# Patient Record
Sex: Female | Born: 1945 | Race: White | Hispanic: No | Marital: Married | Smoking: Former smoker
Health system: Southern US, Community
[De-identification: ages and names within clinical notes are randomized; demographics above are authoritative.]

## PROBLEM LIST (undated history)

## (undated) DIAGNOSIS — Z9889 Other specified postprocedural states: Secondary | ICD-10-CM

## (undated) DIAGNOSIS — M1990 Unspecified osteoarthritis, unspecified site: Secondary | ICD-10-CM

## (undated) DIAGNOSIS — D649 Anemia, unspecified: Secondary | ICD-10-CM

## (undated) DIAGNOSIS — I10 Essential (primary) hypertension: Secondary | ICD-10-CM

## (undated) DIAGNOSIS — R112 Nausea with vomiting, unspecified: Secondary | ICD-10-CM

## (undated) HISTORY — PX: BREAST SURGERY: SHX581

## (undated) HISTORY — PX: TONSILLECTOMY: SUR1361

---

## 1976-01-05 HISTORY — PX: CARPAL TUNNEL RELEASE: SHX101

## 1983-01-05 HISTORY — PX: OTHER SURGICAL HISTORY: SHX169

## 1989-01-04 HISTORY — PX: ABDOMINAL HYSTERECTOMY: SHX81

## 1992-01-05 HISTORY — PX: HEEL SPUR EXCISION: SHX1733

## 1999-01-05 HISTORY — PX: JOINT REPLACEMENT: SHX530

## 2004-01-05 HISTORY — PX: HERNIA REPAIR: SHX51

## 2010-02-04 HISTORY — PX: BIOPSY THYROID: PRO38

## 2010-02-05 ENCOUNTER — Other Ambulatory Visit: Payer: Self-pay | Admitting: Otolaryngology

## 2010-02-05 DIAGNOSIS — E041 Nontoxic single thyroid nodule: Secondary | ICD-10-CM

## 2010-02-10 ENCOUNTER — Telehealth: Payer: Self-pay | Admitting: Radiology

## 2010-02-18 ENCOUNTER — Other Ambulatory Visit (HOSPITAL_COMMUNITY)
Admission: RE | Admit: 2010-02-18 | Discharge: 2010-02-18 | Disposition: A | Discharge status: Home / Self Care | Payer: Medicare Other | Source: Ambulatory Visit | Attending: Interventional Radiology | Admitting: Interventional Radiology

## 2010-02-18 ENCOUNTER — Ambulatory Visit
Admission: RE | Admit: 2010-02-18 | Discharge: 2010-02-18 | Disposition: A | Discharge status: Home / Self Care | Payer: BC Managed Care – PPO | Source: Ambulatory Visit | Attending: Otolaryngology | Admitting: Otolaryngology

## 2010-02-18 ENCOUNTER — Other Ambulatory Visit: Payer: Self-pay | Admitting: Interventional Radiology

## 2010-02-18 DIAGNOSIS — E049 Nontoxic goiter, unspecified: Secondary | ICD-10-CM | POA: Insufficient documentation

## 2010-02-18 DIAGNOSIS — E041 Nontoxic single thyroid nodule: Secondary | ICD-10-CM

## 2010-02-24 ENCOUNTER — Other Ambulatory Visit: Payer: Self-pay

## 2010-06-12 NOTE — Telephone Encounter (Signed)
See above telephone contact note

## 2011-01-14 ENCOUNTER — Encounter (HOSPITAL_COMMUNITY): Payer: Self-pay

## 2011-01-21 ENCOUNTER — Encounter (HOSPITAL_COMMUNITY)
Admission: RE | Admit: 2011-01-21 | Discharge: 2011-01-21 | Disposition: A | Discharge status: Home / Self Care | Payer: Medicare Other | Source: Ambulatory Visit | Attending: Orthopedic Surgery | Admitting: Orthopedic Surgery

## 2011-01-21 ENCOUNTER — Ambulatory Visit (HOSPITAL_COMMUNITY)
Admission: RE | Admit: 2011-01-21 | Discharge: 2011-01-21 | Disposition: A | Discharge status: Home / Self Care | Payer: Medicare Other | Source: Ambulatory Visit | Attending: Orthopedic Surgery | Admitting: Orthopedic Surgery

## 2011-01-21 ENCOUNTER — Encounter (HOSPITAL_COMMUNITY): Payer: Self-pay

## 2011-01-21 DIAGNOSIS — Z01812 Encounter for preprocedural laboratory examination: Secondary | ICD-10-CM | POA: Insufficient documentation

## 2011-01-21 DIAGNOSIS — Z01818 Encounter for other preprocedural examination: Secondary | ICD-10-CM | POA: Insufficient documentation

## 2011-01-21 HISTORY — DX: Unspecified osteoarthritis, unspecified site: M19.90

## 2011-01-21 HISTORY — DX: Anemia, unspecified: D64.9

## 2011-01-21 HISTORY — DX: Essential (primary) hypertension: I10

## 2011-01-21 LAB — DIFFERENTIAL
Lymphocytes Relative: 44 % (ref 12–46)
Lymphs Abs: 2 10*3/uL (ref 0.7–4.0)
Monocytes Relative: 6 % (ref 3–12)
Neutro Abs: 2.3 10*3/uL (ref 1.7–7.7)
Neutrophils Relative %: 49 % (ref 43–77)

## 2011-01-21 LAB — BASIC METABOLIC PANEL
Chloride: 103 mEq/L (ref 96–112)
GFR calc Af Amer: >90 mL/min (ref >90)
GFR calc non Af Amer: >90 mL/min (ref >90)
Potassium: 4 mEq/L (ref 3.5–5.1)
Sodium: 137 mEq/L (ref 135–145)

## 2011-01-21 LAB — TYPE AND SCREEN: ABO/RH(D): O POS

## 2011-01-21 LAB — PROTIME-INR: INR: 0.95 (ref 0.00–1.49)

## 2011-01-21 LAB — SURGICAL PCR SCREEN: Staphylococcus aureus: NEGATIVE

## 2011-01-21 LAB — URINALYSIS, ROUTINE W REFLEX MICROSCOPIC
Ketones, ur: NEGATIVE mg/dL
Leukocytes, UA: NEGATIVE
Nitrite: NEGATIVE
Specific Gravity, Urine: 1.021 (ref 1.005–1.030)
pH: 5.5 (ref 5.0–8.0)

## 2011-01-21 LAB — CBC
Hemoglobin: 12 g/dL (ref 12.0–15.0)
RBC: 4.52 MIL/uL (ref 3.87–5.11)

## 2011-01-21 NOTE — H&P (Signed)
Traci Reed is an 66 y.o. female.    Chief Complaint: right Hip OA and pain   HPI: Pt is a 66 y.o. female complaining of right hip pain since 2011. Pain had continually increased since the beginning, significantly increased and started to really effect her life for the last 6 months. X-rays in the clinic show end-stage arthritic changes of the right hip. Pt has tried various conservative treatments which have failed to alleviate their symptoms, including NSAIDs, PT and steroid injections. Various options are discussed with the patient. Risks, benefits and expectations were discussed with the patient. Patient understand the risks, benefits and expectations and wishes to proceed with surgery.   PCP:  Carmin Richmond, MD, MD  D/C Plans:  Home with HHPT  Post-op Meds:  Rx given ASA, Robaxin, Iron, Colace and MiraLax  Tranexamic Acid:  To be given  PMH: Past Medical History  Diagnosis Date  . Hypertension   . Arthritis   . Diabetes mellitus   . Anemia     PSH: Past Surgical History  Procedure Date  . Biopsy thyroid 02/2010  . Tonsillectomy   . Cervical disk 1985  . Breast surgery     right breast-benign  . Abdominal hysterectomy 1991    complete  . Carpal tunnel release 1978    right  . Heel spur excision 1994    left  . Joint replacement 2001    right  . Hernia repair 2006    abdominal    Social History:  reports that she quit smoking about 16 years ago. Her smoking use included Cigarettes. She has a 30 pack-year smoking history. She does not have any smokeless tobacco history on file. She reports that she does not drink alcohol or use illicit drugs.  Allergies:  No Known Allergies  Medications: No current facility-administered medications on file as of .   Medications Prior to Admission  Medication Sig Dispense Refill  . aspirin EC 81 MG tablet Take 81 mg by mouth every evening.      . Calcium Carbonate-Vit D-Min 1200-1000 MG-UNIT CHEW Chew 1 tablet by mouth 2  (two) times daily.      . cefUROXime (CEFTIN) 500 MG tablet Take 500 mg by mouth 2 (two) times daily.      Marland Kitchen CINNAMON PO Take 1 tablet by mouth 2 (two) times daily.      Marland Kitchen GLUCOSAMINE-CHONDROITIN PO Take 1 tablet by mouth 2 (two) times daily.      . meloxicam (MOBIC) 7.5 MG tablet Take 7.5 mg by mouth 2 (two) times daily.      . metFORMIN (GLUCOPHAGE) 500 MG tablet Take 500-750 mg by mouth 2 (two) times daily. 1.5 tablet in the morning and 1 tablet in the evening      . Multiple Vitamin (MULITIVITAMIN WITH MINERALS) TABS Take 1 tablet by mouth daily before breakfast.      . simvastatin (ZOCOR) 40 MG tablet Take 40 mg by mouth every evening.        Results for orders placed during the hospital encounter of 01/21/11 (from the past 48 hour(s))  URINALYSIS, ROUTINE W REFLEX MICROSCOPIC     Status: Normal   Collection Time   01/21/11 10:56 AM      Component Value Range Comment   Color, Urine YELLOW  YELLOW     APPearance CLEAR  CLEAR     Specific Gravity, Urine 1.021  1.005 - 1.030     pH 5.5  5.0 - 8.0  Glucose, UA NEGATIVE  NEGATIVE (mg/dL)    Hgb urine dipstick NEGATIVE  NEGATIVE     Bilirubin Urine NEGATIVE  NEGATIVE     Ketones, ur NEGATIVE  NEGATIVE (mg/dL)    Protein, ur NEGATIVE  NEGATIVE (mg/dL)    Urobilinogen, UA 0.2  0.0 - 1.0 (mg/dL)    Nitrite NEGATIVE  NEGATIVE     Leukocytes, UA NEGATIVE  NEGATIVE  MICROSCOPIC NOT DONE ON URINES WITH NEGATIVE PROTEIN, BLOOD, LEUKOCYTES, NITRITE, OR GLUCOSE <1000 mg/dL.  SURGICAL PCR SCREEN     Status: Normal   Collection Time   01/21/11 11:19 AM      Component Value Range Comment   MRSA, PCR NEGATIVE  NEGATIVE     Staphylococcus aureus NEGATIVE  NEGATIVE    APTT     Status: Normal   Collection Time   01/21/11 11:40 AM      Component Value Range Comment   aPTT 28  24 - 37 (seconds)   BASIC METABOLIC PANEL     Status: Abnormal   Collection Time   01/21/11 11:40 AM      Component Value Range Comment   Sodium 137  135 - 145 (mEq/L)     Potassium 4.0  3.5 - 5.1 (mEq/L)    Chloride 103  96 - 112 (mEq/L)    CO2 26  19 - 32 (mEq/L)    Glucose, Bld 101 (*) 70 - 99 (mg/dL)    BUN 14  6 - 23 (mg/dL)    Creatinine, Ser 5.78  0.50 - 1.10 (mg/dL)    Calcium 46.9  8.4 - 10.5 (mg/dL)    GFR calc non Af Amer >90  >90 (mL/min)    GFR calc Af Amer >90  >90 (mL/min)   CBC     Status: Normal   Collection Time   01/21/11 11:40 AM      Component Value Range Comment   WBC 4.6  4.0 - 10.5 (K/uL)    RBC 4.52  3.87 - 5.11 (MIL/uL)    Hemoglobin 12.0  12.0 - 15.0 (g/dL)    HCT 62.9  52.8 - 41.3 (%)    MCV 81.6  78.0 - 100.0 (fL)    MCH 26.5  26.0 - 34.0 (pg)    MCHC 32.5  30.0 - 36.0 (g/dL)    RDW 24.4  01.0 - 27.2 (%)    Platelets 217  150 - 400 (K/uL)   DIFFERENTIAL     Status: Normal   Collection Time   01/21/11 11:40 AM      Component Value Range Comment   Neutrophils Relative 49  43 - 77 (%)    Neutro Abs 2.3  1.7 - 7.7 (K/uL)    Lymphocytes Relative 44  12 - 46 (%)    Lymphs Abs 2.0  0.7 - 4.0 (K/uL)    Monocytes Relative 6  3 - 12 (%)    Monocytes Absolute 0.3  0.1 - 1.0 (K/uL)    Eosinophils Relative 1  0 - 5 (%)    Eosinophils Absolute 0.0  0.0 - 0.7 (K/uL)    Basophils Relative 0  0 - 1 (%)    Basophils Absolute 0.0  0.0 - 0.1 (K/uL)   PROTIME-INR     Status: Normal   Collection Time   01/21/11 11:40 AM      Component Value Range Comment   Prothrombin Time 12.9  11.6 - 15.2 (seconds)    INR 0.95  0.00 - 1.49  TYPE AND SCREEN     Status: Normal   Collection Time   01/21/11 11:40 AM      Component Value Range Comment   ABO/RH(D) O POS      Antibody Screen NEG      Sample Expiration 01/24/2011     ABO/RH     Status: Normal   Collection Time   01/21/11  1:00 PM      Component Value Range Comment   ABO/RH(D) O POS      Dg Chest 2 View  01/21/2011  *RADIOLOGY REPORT*  Clinical Data: Preop right total hip arthroplasty  CHEST - 2 VIEW  Comparison: None  Findings: The heart size and mediastinal contours are within  normal limits.  Both lungs are clear.  The visualized skeletal structures are unremarkable.  IMPRESSION: No active cardiopulmonary abnormalities.  Original Report Authenticated By: Rosealee Albee, M.D.    ROS: Review of Systems  Constitutional: Negative.   HENT: Negative.   Eyes: Negative.   Respiratory: Negative.   Cardiovascular: Negative.   Gastrointestinal: Positive for constipation.  Musculoskeletal: Positive for joint pain.  Skin: Negative.   Neurological: Negative.   Endo/Heme/Allergies: Negative.   Psychiatric/Behavioral: Negative.      Physican Exam: BP: 152/82 ; HR: 80 ; Resp: 16 Physical Exam  Constitutional: She is oriented to person, place, and time and well-developed, well-nourished, and in no distress.  HENT:  Head: Normocephalic and atraumatic.  Nose: Nose normal.  Mouth/Throat: Oropharynx is clear and moist.  Eyes: Pupils are equal, round, and reactive to light.  Neck: Neck supple. No JVD present. No tracheal deviation present. No thyromegaly present.  Cardiovascular: Normal rate, regular rhythm, normal heart sounds and intact distal pulses.   Pulmonary/Chest: Effort normal and breath sounds normal. No stridor. No respiratory distress. She has no wheezes. She has no rales. She exhibits no tenderness.  Abdominal: Soft. There is no tenderness. There is no guarding.  Musculoskeletal:       Right hip: She exhibits decreased range of motion (Significantly decrease ROM with pain with flexion, abduction, internal and external rotation), tenderness (Some lateral TTP, but most of the pain is referred to the groin) and bony tenderness. She exhibits no swelling, no deformity and no laceration.  Lymphadenopathy:    She has no cervical adenopathy.  Neurological: She is alert and oriented to person, place, and time.  Skin: Skin is warm and dry. No rash noted. No erythema. No pallor.  Psychiatric: Affect normal.      Assessment/Plan Assessment: right Hip OA and pain    Plan: Patient will undergo a right total hip arthroplasty, anterior approach on 01/28/2011. Risks benefits and expectation were discussed with the patient. Patient understand risks, benefits and expectation and wishes to proceed.   Anastasio Auerbach Anyra Kaufman   PAC  01/21/2011, 3:10 PM

## 2011-01-21 NOTE — Pre-Procedure Instructions (Signed)
EKG from [facility] for 01/19/2011 (4 EKG's) on chart

## 2011-01-21 NOTE — Patient Instructions (Signed)
20 Traci Reed  01/21/2011   Your procedure is scheduled on:  Thursday 01/28/2011 at 0955am  Report to [facility] at [phone] AM.  Call this number if you have problems the morning of surgery: [phone]   Remember:   Do not eat food:After Midnight.  May have clear liquids:until Midnight .  Clear liquids include soda, tea, black coffee, apple or grape juice, broth.  Take these medicines the morning of surgery with A SIP OF WATER: none   Do not wear jewelry, make-up or nail polish.  Do not wear lotions, powders, or perfumes.   Do not shave 48 hours prior to surgery.(women only-shaving legs)  Do not bring valuables to the hospital.  Contacts, dentures or bridgework may not be worn into surgery.  Leave suitcase in the car. After surgery it may be brought to your room.  For patients admitted to the hospital, checkout time is 11:00 AM the day of discharge.   Patients discharged the day of surgery will not be allowed to drive home.  Name and phone number of your driver:   Special Instructions: CHG Shower Use Special Wash: 1/2 bottle night before surgery and 1/2 bottle morning of surgery.   Please read over the following fact sheets that you were given: MRSA Information

## 2011-01-28 ENCOUNTER — Encounter (HOSPITAL_COMMUNITY)
Admission: RE | Disposition: A | Discharge status: Home Health | Payer: Self-pay | Source: Ambulatory Visit | Attending: Orthopedic Surgery

## 2011-01-28 ENCOUNTER — Encounter (HOSPITAL_COMMUNITY): Payer: Self-pay | Admitting: Anesthesiology

## 2011-01-28 ENCOUNTER — Inpatient Hospital Stay (HOSPITAL_COMMUNITY): Payer: Medicare Other

## 2011-01-28 ENCOUNTER — Encounter (HOSPITAL_COMMUNITY): Payer: Self-pay | Admitting: *Deleted

## 2011-01-28 ENCOUNTER — Inpatient Hospital Stay (HOSPITAL_COMMUNITY): Payer: Medicare Other | Admitting: Anesthesiology

## 2011-01-28 ENCOUNTER — Inpatient Hospital Stay (HOSPITAL_COMMUNITY)
Admission: RE | Admit: 2011-01-28 | Discharge: 2011-01-31 | DRG: 470 | Disposition: A | Discharge status: Home Health | Payer: Medicare Other | Source: Ambulatory Visit | Attending: Orthopedic Surgery | Admitting: Orthopedic Surgery

## 2011-01-28 DIAGNOSIS — Z96649 Presence of unspecified artificial hip joint: Secondary | ICD-10-CM

## 2011-01-28 DIAGNOSIS — M169 Osteoarthritis of hip, unspecified: Principal | ICD-10-CM | POA: Diagnosis present

## 2011-01-28 DIAGNOSIS — M1610 Unilateral primary osteoarthritis, unspecified hip: Principal | ICD-10-CM | POA: Diagnosis present

## 2011-01-28 DIAGNOSIS — I10 Essential (primary) hypertension: Secondary | ICD-10-CM | POA: Diagnosis present

## 2011-01-28 DIAGNOSIS — E119 Type 2 diabetes mellitus without complications: Secondary | ICD-10-CM | POA: Diagnosis present

## 2011-01-28 HISTORY — PX: TOTAL HIP ARTHROPLASTY: SHX124

## 2011-01-28 LAB — GLUCOSE, CAPILLARY

## 2011-01-28 SURGERY — ARTHROPLASTY, HIP, TOTAL, ANTERIOR APPROACH
Anesthesia: General | Site: Hip | Laterality: Right | Wound class: Clean

## 2011-01-28 MED ORDER — PROMETHAZINE HCL 25 MG/ML IJ SOLN
6.2500 mg | INTRAMUSCULAR | Status: DC | PRN
  Administered 2011-01-28: 6.25 mg via INTRAVENOUS

## 2011-01-28 MED ORDER — RIVAROXABAN 10 MG PO TABS
10.0000 mg | ORAL_TABLET | ORAL | Status: DC
  Administered 2011-01-30: 10 mg via ORAL
  Filled 2011-01-28 (×5): qty 1

## 2011-01-28 MED ORDER — POTASSIUM CHLORIDE 2 MEQ/ML IV SOLN
100.0000 mL/h | INTRAVENOUS | Status: DC
  Administered 2011-01-28 – 2011-01-29 (×2): 100 mL/h via INTRAVENOUS
  Filled 2011-01-28 (×11): qty 1000

## 2011-01-28 MED ORDER — ROCURONIUM BROMIDE 100 MG/10ML IV SOLN
INTRAVENOUS | Status: DC | PRN
  Administered 2011-01-28: 50 mg via INTRAVENOUS

## 2011-01-28 MED ORDER — MENTHOL 3 MG MT LOZG
1.0000 | LOZENGE | OROMUCOSAL | Status: DC | PRN
  Filled 2011-01-28: qty 9

## 2011-01-28 MED ORDER — METHOCARBAMOL 500 MG PO TABS
500.0000 mg | ORAL_TABLET | Freq: Four times a day (QID) | ORAL | Status: DC | PRN
  Administered 2011-01-28: 500 mg via ORAL
  Filled 2011-01-28: qty 1

## 2011-01-28 MED ORDER — CEFAZOLIN SODIUM-DEXTROSE 2-3 GM-% IV SOLR
2.0000 g | Freq: Four times a day (QID) | INTRAVENOUS | Status: AC
  Administered 2011-01-28 – 2011-01-29 (×3): 2 g via INTRAVENOUS
  Filled 2011-01-28 (×3): qty 50

## 2011-01-28 MED ORDER — METFORMIN HCL 500 MG PO TABS: 500.0000 mg | ORAL_TABLET | Freq: Two times a day (BID) | ORAL | Status: DC

## 2011-01-28 MED ORDER — INSULIN ASPART 100 UNIT/ML ~~LOC~~ SOLN
0.0000 [IU] | Freq: Three times a day (TID) | SUBCUTANEOUS | Status: DC
  Administered 2011-01-28: 2 [IU] via SUBCUTANEOUS
  Administered 2011-01-29 (×2): 3 [IU] via SUBCUTANEOUS
  Administered 2011-01-29 – 2011-01-30 (×3): 2 [IU] via SUBCUTANEOUS
  Administered 2011-01-30: 3 [IU] via SUBCUTANEOUS
  Administered 2011-01-31: 2 [IU] via SUBCUTANEOUS
  Filled 2011-01-28: qty 3

## 2011-01-28 MED ORDER — FENTANYL CITRATE 0.05 MG/ML IJ SOLN
25.0000 ug | INTRAMUSCULAR | Status: DC | PRN
  Administered 2011-01-28 (×2): 50 ug via INTRAVENOUS

## 2011-01-28 MED ORDER — TRANEXAMIC ACID 100 MG/ML IV SOLN
1620.0000 mg | Freq: Once | INTRAVENOUS | Status: AC
  Administered 2011-01-28: 12:00:00 via INTRAVENOUS
  Filled 2011-01-28: qty 16.2

## 2011-01-28 MED ORDER — METFORMIN HCL 500 MG PO TABS
500.0000 mg | ORAL_TABLET | Freq: Every day | ORAL | Status: DC
  Administered 2011-01-28 – 2011-01-30 (×3): 500 mg via ORAL
  Filled 2011-01-28 (×5): qty 1

## 2011-01-28 MED ORDER — DIPHENHYDRAMINE HCL 25 MG PO CAPS: 25.0000 mg | ORAL_CAPSULE | Freq: Four times a day (QID) | ORAL | Status: DC | PRN

## 2011-01-28 MED ORDER — SIMVASTATIN 40 MG PO TABS
40.0000 mg | ORAL_TABLET | Freq: Every evening | ORAL | Status: DC
  Administered 2011-01-28 – 2011-01-30 (×3): 40 mg via ORAL
  Filled 2011-01-28 (×4): qty 1

## 2011-01-28 MED ORDER — METFORMIN HCL 500 MG PO TABS
750.0000 mg | ORAL_TABLET | Freq: Every day | ORAL | Status: DC
  Administered 2011-01-29 – 2011-01-31 (×3): 750 mg via ORAL
  Filled 2011-01-28 (×4): qty 2

## 2011-01-28 MED ORDER — NEOSTIGMINE METHYLSULFATE 1 MG/ML IJ SOLN
INTRAMUSCULAR | Status: DC | PRN
  Administered 2011-01-28: 2 mg via INTRAVENOUS

## 2011-01-28 MED ORDER — ZOLPIDEM TARTRATE 5 MG PO TABS: 5.0000 mg | ORAL_TABLET | Freq: Every evening | ORAL | Status: DC | PRN

## 2011-01-28 MED ORDER — POLYETHYLENE GLYCOL 3350 17 G PO PACK
17.0000 g | PACK | Freq: Two times a day (BID) | ORAL | Status: DC
  Administered 2011-01-28 – 2011-01-31 (×6): 17 g via ORAL
  Filled 2011-01-28 (×7): qty 1

## 2011-01-28 MED ORDER — SUFENTANIL CITRATE 50 MCG/ML IV SOLN
INTRAVENOUS | Status: DC | PRN
  Administered 2011-01-28: 7.5 ug via INTRAVENOUS
  Administered 2011-01-28: 5 ug via INTRAVENOUS
  Administered 2011-01-28: 7.5 ug via INTRAVENOUS
  Administered 2011-01-28 (×3): 5 ug via INTRAVENOUS

## 2011-01-28 MED ORDER — ACETAMINOPHEN 10 MG/ML IV SOLN
INTRAVENOUS | Status: DC | PRN
  Administered 2011-01-28: 1000 mg via INTRAVENOUS

## 2011-01-28 MED ORDER — ACETAMINOPHEN 325 MG PO TABS
650.0000 mg | ORAL_TABLET | Freq: Four times a day (QID) | ORAL | Status: DC | PRN
  Filled 2011-01-28: qty 2

## 2011-01-28 MED ORDER — 0.9 % SODIUM CHLORIDE (POUR BTL) OPTIME
TOPICAL | Status: DC | PRN
  Administered 2011-01-28: 1000 mL

## 2011-01-28 MED ORDER — METOCLOPRAMIDE HCL 10 MG PO TABS: 5.0000 mg | ORAL_TABLET | Freq: Three times a day (TID) | ORAL | Status: DC | PRN

## 2011-01-28 MED ORDER — HYDROMORPHONE HCL PF 1 MG/ML IJ SOLN
0.5000 mg | INTRAMUSCULAR | Status: DC | PRN
  Administered 2011-01-28: 1 mg via INTRAVENOUS
  Filled 2011-01-28: qty 1

## 2011-01-28 MED ORDER — PROPOFOL 10 MG/ML IV BOLUS
INTRAVENOUS | Status: DC | PRN
  Administered 2011-01-28: 200 mg via INTRAVENOUS

## 2011-01-28 MED ORDER — BISACODYL 5 MG PO TBEC: 5.0000 mg | DELAYED_RELEASE_TABLET | Freq: Every day | ORAL | Status: DC | PRN

## 2011-01-28 MED ORDER — CHLORHEXIDINE GLUCONATE 4 % EX LIQD: 60.0000 mL | Freq: Once | CUTANEOUS | Status: DC

## 2011-01-28 MED ORDER — LIDOCAINE HCL (CARDIAC) 20 MG/ML IV SOLN
INTRAVENOUS | Status: DC | PRN
  Administered 2011-01-28: 40 mg via INTRAVENOUS

## 2011-01-28 MED ORDER — PROMETHAZINE HCL 25 MG/ML IJ SOLN
INTRAMUSCULAR | Status: AC
  Filled 2011-01-28: qty 1

## 2011-01-28 MED ORDER — HYDROCODONE-ACETAMINOPHEN 7.5-325 MG PO TABS
1.0000 | ORAL_TABLET | ORAL | Status: DC
  Administered 2011-01-28: 1 via ORAL
  Administered 2011-01-28: 2 via ORAL
  Administered 2011-01-29: 1 via ORAL
  Administered 2011-01-29: 2 via ORAL
  Administered 2011-01-29 (×2): 1 via ORAL
  Administered 2011-01-29 (×2): 2 via ORAL
  Administered 2011-01-30 – 2011-01-31 (×8): 1 via ORAL
  Filled 2011-01-28 (×4): qty 1
  Filled 2011-01-28 (×3): qty 2
  Filled 2011-01-28 (×3): qty 1
  Filled 2011-01-28: qty 2
  Filled 2011-01-28 (×3): qty 1
  Filled 2011-01-28: qty 2
  Filled 2011-01-28: qty 1

## 2011-01-28 MED ORDER — CEFAZOLIN SODIUM-DEXTROSE 2-3 GM-% IV SOLR
2.0000 g | Freq: Once | INTRAVENOUS | Status: AC
  Administered 2011-01-28: 2 g via INTRAVENOUS

## 2011-01-28 MED ORDER — ACETAMINOPHEN 650 MG RE SUPP: 650.0000 mg | Freq: Four times a day (QID) | RECTAL | Status: DC | PRN

## 2011-01-28 MED ORDER — LACTATED RINGERS IV SOLN
INTRAVENOUS | Status: DC
  Administered 2011-01-28: 1000 mL via INTRAVENOUS
  Administered 2011-01-28 (×2): via INTRAVENOUS

## 2011-01-28 MED ORDER — METHOCARBAMOL 100 MG/ML IJ SOLN
500.0000 mg | Freq: Four times a day (QID) | INTRAVENOUS | Status: DC | PRN
  Administered 2011-01-28: 500 mg via INTRAVENOUS
  Filled 2011-01-28 (×2): qty 5

## 2011-01-28 MED ORDER — FLEET ENEMA 7-19 GM/118ML RE ENEM: 1.0000 | ENEMA | Freq: Once | RECTAL | Status: AC | PRN

## 2011-01-28 MED ORDER — METOCLOPRAMIDE HCL 5 MG/ML IJ SOLN
5.0000 mg | Freq: Three times a day (TID) | INTRAMUSCULAR | Status: DC | PRN
  Administered 2011-01-29: 10 mg via INTRAVENOUS
  Filled 2011-01-28: qty 2

## 2011-01-28 MED ORDER — ONDANSETRON HCL 4 MG/2ML IJ SOLN
INTRAMUSCULAR | Status: DC | PRN
  Administered 2011-01-28: 4 mg via INTRAVENOUS

## 2011-01-28 MED ORDER — DOCUSATE SODIUM 100 MG PO CAPS
100.0000 mg | ORAL_CAPSULE | Freq: Two times a day (BID) | ORAL | Status: DC
  Administered 2011-01-28 – 2011-01-31 (×6): 100 mg via ORAL
  Filled 2011-01-28 (×7): qty 1

## 2011-01-28 MED ORDER — MIDAZOLAM HCL 5 MG/5ML IJ SOLN
INTRAMUSCULAR | Status: DC | PRN
  Administered 2011-01-28: 2 mg via INTRAVENOUS

## 2011-01-28 MED ORDER — ONDANSETRON HCL 4 MG PO TABS: 4.0000 mg | ORAL_TABLET | Freq: Four times a day (QID) | ORAL | Status: DC | PRN

## 2011-01-28 MED ORDER — TRANEXAMIC ACID 100 MG/ML IV SOLN
1620.0000 mg | INTRAVENOUS | Status: DC | PRN
  Administered 2011-01-28: 1620 mg via INTRAVENOUS

## 2011-01-28 MED ORDER — GLYCOPYRROLATE 0.2 MG/ML IJ SOLN
INTRAMUSCULAR | Status: DC | PRN
  Administered 2011-01-28: .3 mg via INTRAVENOUS

## 2011-01-28 MED ORDER — ONDANSETRON HCL 4 MG/2ML IJ SOLN
4.0000 mg | Freq: Four times a day (QID) | INTRAMUSCULAR | Status: DC | PRN
  Administered 2011-01-29: 4 mg via INTRAVENOUS
  Filled 2011-01-28: qty 2

## 2011-01-28 MED ORDER — ALUM & MAG HYDROXIDE-SIMETH 200-200-20 MG/5ML PO SUSP: 30.0000 mL | ORAL | Status: DC | PRN

## 2011-01-28 MED ORDER — FERROUS SULFATE 325 (65 FE) MG PO TABS
325.0000 mg | ORAL_TABLET | Freq: Three times a day (TID) | ORAL | Status: DC
  Administered 2011-01-28 – 2011-01-31 (×8): 325 mg via ORAL
  Filled 2011-01-28 (×10): qty 1

## 2011-01-28 MED ORDER — PHENOL 1.4 % MT LIQD
1.0000 | OROMUCOSAL | Status: DC | PRN
  Filled 2011-01-28: qty 177

## 2011-01-28 MED ORDER — EPHEDRINE SULFATE 50 MG/ML IJ SOLN
INTRAMUSCULAR | Status: DC | PRN
  Administered 2011-01-28 (×6): 5 mg via INTRAVENOUS

## 2011-01-28 MED ORDER — LACTATED RINGERS IV SOLN: INTRAVENOUS | Status: DC

## 2011-01-28 SURGICAL SUPPLY — 43 items
BAG ZIPLOCK 12X15 (MISCELLANEOUS) ×4 IMPLANT
BLADE SAW SGTL 18X1.27X75 (BLADE) ×2 IMPLANT
CELLS DAT CNTRL 66122 CELL SVR (MISCELLANEOUS) ×1 IMPLANT
CLOTH BEACON ORANGE TIMEOUT ST (SAFETY) ×2 IMPLANT
DERMABOND ADVANCED (GAUZE/BANDAGES/DRESSINGS) ×1
DERMABOND ADVANCED .7 DNX12 (GAUZE/BANDAGES/DRESSINGS) ×1 IMPLANT
DRAPE C-ARM 42X72 X-RAY (DRAPES) ×2 IMPLANT
DRAPE STERI IOBAN 125X83 (DRAPES) ×2 IMPLANT
DRAPE U-SHAPE 47X51 STRL (DRAPES) ×6 IMPLANT
DRSG AQUACEL AG ADV 3.5X10 (GAUZE/BANDAGES/DRESSINGS) ×2 IMPLANT
DRSG TEGADERM 4X4.75 (GAUZE/BANDAGES/DRESSINGS) ×2 IMPLANT
DURAPREP 26ML APPLICATOR (WOUND CARE) ×2 IMPLANT
ELECT BLADE TIP CTD 4 INCH (ELECTRODE) ×2 IMPLANT
ELECT REM PT RETURN 9FT ADLT (ELECTROSURGICAL) ×2
ELECTRODE REM PT RTRN 9FT ADLT (ELECTROSURGICAL) ×1 IMPLANT
EVACUATOR 1/8 PVC DRAIN (DRAIN) IMPLANT
FACESHIELD LNG OPTICON STERILE (SAFETY) ×8 IMPLANT
GAUZE SPONGE 2X2 8PLY STRL LF (GAUZE/BANDAGES/DRESSINGS) ×1 IMPLANT
GLOVE BIOGEL PI IND STRL 7.0 (GLOVE) ×2 IMPLANT
GLOVE BIOGEL PI IND STRL 7.5 (GLOVE) ×2 IMPLANT
GLOVE BIOGEL PI IND STRL 8 (GLOVE) ×1 IMPLANT
GLOVE BIOGEL PI INDICATOR 7.0 (GLOVE) ×2
GLOVE BIOGEL PI INDICATOR 7.5 (GLOVE) ×2
GLOVE BIOGEL PI INDICATOR 8 (GLOVE) ×1
GLOVE ECLIPSE 8.0 STRL XLNG CF (GLOVE) ×2 IMPLANT
GLOVE ORTHO TXT STRL SZ7.5 (GLOVE) ×4 IMPLANT
GLOVE SURG SS PI 6.5 STRL IVOR (GLOVE) ×2 IMPLANT
GLOVE SURG SS PI 7.0 STRL IVOR (GLOVE) ×4 IMPLANT
GLOVE SURG SS PI 7.5 STRL IVOR (GLOVE) ×2 IMPLANT
GOWN BRE IMP PREV XXLGXLNG (GOWN DISPOSABLE) ×2 IMPLANT
GOWN STRL NON-REIN LRG LVL3 (GOWN DISPOSABLE) ×2 IMPLANT
KIT BASIN OR (CUSTOM PROCEDURE TRAY) ×2 IMPLANT
PACK TOTAL JOINT (CUSTOM PROCEDURE TRAY) ×2 IMPLANT
PADDING CAST COTTON 6X4 STRL (CAST SUPPLIES) ×2 IMPLANT
RTRCTR WOUND ALEXIS 18CM MED (MISCELLANEOUS) ×2
SPONGE GAUZE 2X2 STER 10/PKG (GAUZE/BANDAGES/DRESSINGS) ×1
SUCTION FRAZIER 12FR DISP (SUCTIONS) ×2 IMPLANT
SUT MNCRL AB 4-0 PS2 18 (SUTURE) ×2 IMPLANT
SUT VIC AB 1 CT1 36 (SUTURE) ×8 IMPLANT
SUT VIC AB 2-0 CT1 27 (SUTURE) ×2
SUT VIC AB 2-0 CT1 TAPERPNT 27 (SUTURE) ×2 IMPLANT
TOWEL OR 17X26 10 PK STRL BLUE (TOWEL DISPOSABLE) ×4 IMPLANT
TRAY FOLEY CATH 14FRSI W/METER (CATHETERS) ×2 IMPLANT

## 2011-01-28 NOTE — Transfer of Care (Signed)
Immediate Anesthesia Transfer of Care Note  Patient: Traci Reed  Procedure(s) Performed:  TOTAL HIP ARTHROPLASTY ANTERIOR APPROACH  Patient Location: PACU  Anesthesia Type: General  Level of Consciousness: awake, alert  and oriented  Airway & Oxygen Therapy: Patient Spontanous Breathing and Patient connected to face mask oxygen  Post-op Assessment: Report given to PACU RN  Post vital signs: Reviewed and stable  Complications: No apparent anesthesia complications

## 2011-01-28 NOTE — Interval H&P Note (Signed)
History and Physical Interval Note:  01/28/2011 7:20 AM  [name]  has presented today for surgery, with the diagnosis of Ostearthritis of Right Hip  The various methods of treatment have been discussed with the patient and family. After consideration of risks, benefits and other options for treatment, the patient has consented to  Procedure(s): RIGHT TOTAL HIP ARTHROPLASTY ANTERIOR APPROACH as a surgical intervention .  The patients' history has been reviewed, patient examined, no change in status, stable for surgery.  I have reviewed the patients' chart and labs.  Questions were answered to the patient's satisfaction.     Traci Reed

## 2011-01-28 NOTE — Op Note (Signed)
NAME:  Traci Reed                ACCOUNT NO.: [id number]      MEDICAL RECORD NO.: [id number]      FACILITY:  [facility]      PHYSICIAN:  Durene Romans D  DATE OF BIRTH:  [birth year]     DATE OF PROCEDURE:  01/28/2011                                 OPERATIVE REPORT         PREOPERATIVE DIAGNOSIS: Right  hip osteoarthritis.      POSTOPERATIVE DIAGNOSIS:  Right osteoarthritis.      PROCEDURE:  Right total hip replacement through an anterior approach   utilizing DePuy THR system, component size 52 pinnacle cup, a size 36+4 neutral   Altrex liner, a size 6Hi Tri Lock stem with a 36-2 aSphere metal head.   ball.      SURGEON:  Madlyn Frankel. Charlann Boxer, M.D.      ASSISTANT:  Lanney Gins, PA      ANESTHESIA:  General.      SPECIMENS:  None.      COMPLICATIONS:  None.      BLOOD LOSS:  600 cc     DRAINS:  One Hemovac.      INDICATION OF THE PROCEDURE:  Traci Reed is a 66 y.o. female who had   presented to office for evaluation of right hip pain.  Radiographs revealed   progressive degenerative changes with bone-on-bone   articulation to the  hip joint.  The patient had painful limited range of   motion significantly affecting their overall quality of life.  The patient was failing to    respond to conservative measures, and at this point was ready   to proceed with more definitive measures.  The patient has noted progressive   degenerative changes in his hip, progressive problems and dysfunction   with regarding the hip prior to surgery.  Consent was obtained for   benefit of pain relief.  Specific risk of infection, DVT, component   failure, dislocation, need for revision surgery, as well discussion of   the anterior versus posterior approach were reviewed.  Consent was   obtained for benefit of anterior pain relief through an anterior   approach.      PROCEDURE IN DETAIL:  The patient was brought to operative theater.   Once adequate anesthesia, preoperative  antibiotics, 2gm Ancef administered.   The patient was positioned supine on the OSI Hanna table.  Once adequate   padding of boney process was carried out, we had predraped out the hip, and  used fluoroscopy to confirm orientation of the pelvis and position.      The right hip was then prepped and draped from proximal iliac crest to   mid thigh with shower curtain technique.      Time-out was performed identifying the patient, planned procedure, and   extremity.     An incision was then made 2 cm distal and lateral to the   anterior superior iliac spine extending over the orientation of the   tensor fascia lata muscle and sharp dissection was carried down to the   fascia of the muscle and protractor placed in the soft tissues.      The fascia was then incised.  The muscle belly was identified and swept  laterally and retractor placed along the superior neck.  Following   cauterization of the circumflex vessels and removing some pericapsular   fat, a second cobra retractor was placed on the inferior neck.  A third   retractor was placed on the anterior acetabulum after elevating the   anterior rectus.  A L-capsulotomy was along the line of the   superior neck to the trochanteric fossa, then extended proximally and   distally.  Tag sutures were placed and the retractors were then placed   intracapsular.  We then identified the trochanteric fossa and   orientation of my neck cut, confirmed this radiographically   and then made a neck osteotomy with the femur on traction.  The femoral   head was removed without difficulty or complication.  Traction was let   off and retractors were placed posterior and anterior around the   acetabulum.      The labrum and foveal tissue were debrided.  I began reaming with a 46mm   reamer and reamed up to 51 reamer with good bony bed preparation and a 52mm   cup was chosen.  The final 52 Pinnacle cup was then impacted under fluoroscopy  to confirm the  depth of penetration and orientation with respect to   abduction.  A screw was placed followed by the hole eliminator.  The final   36+4 Altrex liner was impacted with good visualized rim fit.  The cup was positioned anatomically within the acetabular portion of the pelvis.      At this point, the femur was rolled at 80 degrees.  Further capsule was   released off the inferior aspect of the femoral neck.  I then   released the superior capsule proximally.  The hook was placed laterally   along the femur and elevated manually and held in position with the bed   hook.  The leg was then extended and adducted with the leg rolled to 100   degrees of external rotation.  Once the proximal femur was fully   exposed, I used a box osteotome to set orientation.  I then began   broaching with the starting chili pepper broach and passed this by hand and then broached up to 6.  With the 6 broach in place I chose a high offset neck and did a trial reduction.   The offset was appropriate, leg lengths   appeared to be equal using a 36-2 head, confirmed radiographically.   Given these findings, I went ahead and dislocated the hip, repositioned all   retractors and positioned the right hip in the extended and abducted position.  The final 6Hi  Tri Lock stem was   chosen and it was impacted down to the level of neck cut.  Based on this   and the trial reduction, a 36-2 aSphere metal head was chosen and   impacted onto a clean and dry trunnion, and the hip was reduced.  The   hip had been irrigated throughout the case again at this point.  I did   reapproximate the superior capsular leaflet to the anterior leaflet   using #1 Vicryl, placed a medium Hemovac drain deep.  The fascia of the   tensor fascia lata muscle was then reapproximated using #1 Vicryl.  The   remaining wound was closed with 2-0 Vicryl and running 4-0 Monocryl.   The hip was cleaned, dried, and dressed sterilely using Dermabond and   Aquacel  dressing.  Drain site dressed  separately.  She was then brought   to recovery room in stable condition tolerating the procedure well.    Lanney Gins, PA-C was present for the entirety of the case involved from   preoperative positioning, perioperative retractor management, general   facilitation of the case, as well as primary wound closure as assistant.            Madlyn Frankel Charlann Boxer, M.D.            MDO/MEDQ  D:  10/27/2010  T:  10/27/2010  Job:  [id number]      Electronically Signed by Durene Romans M.D. on 11/02/2010 09:15:38 AM

## 2011-01-28 NOTE — Anesthesia Postprocedure Evaluation (Signed)
Anesthesia Post Note  Patient: Traci Reed  Procedure(s) Performed:  TOTAL HIP ARTHROPLASTY ANTERIOR APPROACH  Anesthesia type: General  Patient location: PACU  Post pain: Pain level controlled  Post assessment: Post-op Vital signs reviewed  Last Vitals:  Filed Vitals:   01/28/11 1430  BP: 114/49  Pulse: 81  Temp: 36.8 C  Resp: 18    Post vital signs: Reviewed  Level of consciousness: sedated  Complications: No apparent anesthesia complications

## 2011-01-28 NOTE — Addendum Note (Signed)
Addendum  created 01/28/11 1507 by Garth Bigness, CRNA   Modules edited:Anesthesia Events, Notes Section

## 2011-01-28 NOTE — Transfer of Care (Signed)
Immediate Anesthesia Transfer of Care Note  Patient: Traci Reed  Procedure(s) Performed:  TOTAL HIP ARTHROPLASTY ANTERIOR APPROACH  Patient Location: PACU  Anesthesia Type: General  Level of Consciousness: awake, alert  and oriented  Airway & Oxygen Therapy: Patient Spontanous Breathing and Patient connected to face mask oxygen  Post-op Assessment: Report given to PACU RN, Post -op Vital signs reviewed and stable and Patient moving all extremities  Post vital signs: Reviewed and stable  Complications: No apparent anesthesia complications

## 2011-01-28 NOTE — Anesthesia Preprocedure Evaluation (Signed)
Anesthesia Evaluation  Patient identified by MRN, date of birth, ID band Patient awake    Reviewed: Allergy & Precautions, H&P , NPO status , Patient's Chart, lab work & pertinent test results  History of Anesthesia Complications Negative for: history of anesthetic complications  Airway Mallampati: II TM Distance: >3 FB Neck ROM: Full    Dental No notable dental hx. (+) Teeth Intact and Dental Advisory Given   Pulmonary neg pulmonary ROS,  clear to auscultation  Pulmonary exam normal       Cardiovascular hypertension, Regular Normal    Neuro/Psych Negative Neurological ROS  Negative Psych ROS   GI/Hepatic negative GI ROS, Neg liver ROS,   Endo/Other  Negative Endocrine ROSDiabetes mellitus-, Type 2, Oral Hypoglycemic AgentsMorbid obesity  Renal/GU negative Renal ROS  Genitourinary negative   Musculoskeletal negative musculoskeletal ROS (+)   Abdominal   Peds negative pediatric ROS (+)  Hematology negative hematology ROS (+)   Anesthesia Other Findings   Reproductive/Obstetrics negative OB ROS                           Anesthesia Physical Anesthesia Plan  ASA: II  Anesthesia Plan: General   Post-op Pain Management:    Induction: Intravenous  Airway Management Planned: Oral ETT  Additional Equipment:   Intra-op Plan:   Post-operative Plan: Extubation in OR  Informed Consent: I have reviewed the patients History and Physical, chart, labs and discussed the procedure including the risks, benefits and alternatives for the proposed anesthesia with the patient or authorized representative who has indicated his/her understanding and acceptance.   Dental advisory given  Plan Discussed with: CRNA  Anesthesia Plan Comments:         Anesthesia Quick Evaluation

## 2011-01-29 LAB — BASIC METABOLIC PANEL
BUN: 8 mg/dL (ref 6–23)
Chloride: 102 mEq/L (ref 96–112)
GFR calc Af Amer: >90 mL/min (ref >90)
GFR calc non Af Amer: >90 mL/min (ref >90)
Glucose, Bld: 166 mg/dL — ABNORMAL HIGH (ref 70–99)
Potassium: 4 mEq/L (ref 3.5–5.1)
Sodium: 135 mEq/L (ref 135–145)

## 2011-01-29 LAB — GLUCOSE, CAPILLARY
Glucose-Capillary: 126 mg/dL — ABNORMAL HIGH (ref 70–99)
Glucose-Capillary: 174 mg/dL — ABNORMAL HIGH (ref 70–99)
Glucose-Capillary: 173 mg/dL — ABNORMAL HIGH (ref 70–99)

## 2011-01-29 LAB — CBC
HCT: 29.4 % — ABNORMAL LOW (ref 36.0–46.0)
Hemoglobin: 9.7 g/dL — ABNORMAL LOW (ref 12.0–15.0)
MCHC: 33 g/dL (ref 30.0–36.0)
RBC: 3.54 MIL/uL — ABNORMAL LOW (ref 3.87–5.11)
WBC: 8.8 10*3/uL (ref 4.0–10.5)

## 2011-01-29 MED ORDER — DIPHENHYDRAMINE HCL 25 MG PO CAPS: 25.0000 mg | ORAL_CAPSULE | Freq: Four times a day (QID) | ORAL | Status: AC | PRN

## 2011-01-29 MED ORDER — FERROUS SULFATE 325 (65 FE) MG PO TABS: 325.0000 mg | ORAL_TABLET | Freq: Three times a day (TID) | ORAL | Status: DC

## 2011-01-29 MED ORDER — HYDROCODONE-ACETAMINOPHEN 7.5-325 MG PO TABS: 1.0000 | ORAL_TABLET | ORAL | Status: AC

## 2011-01-29 MED ORDER — METHOCARBAMOL 500 MG PO TABS: 500.0000 mg | ORAL_TABLET | Freq: Four times a day (QID) | ORAL | Status: AC | PRN

## 2011-01-29 MED ORDER — ASPIRIN EC 325 MG PO TBEC: 325.0000 mg | DELAYED_RELEASE_TABLET | Freq: Two times a day (BID) | ORAL | Status: AC

## 2011-01-29 MED ORDER — DSS 100 MG PO CAPS: 100.0000 mg | ORAL_CAPSULE | Freq: Two times a day (BID) | ORAL | Status: AC

## 2011-01-29 MED ORDER — POLYETHYLENE GLYCOL 3350 17 G PO PACK: 17.0000 g | PACK | Freq: Two times a day (BID) | ORAL | Status: AC

## 2011-01-29 NOTE — Progress Notes (Signed)
CARE MANAGEMENT NOTE 01/29/2011  Patient:  Traci Reed, Traci Reed   Account Number:  [id number]  Date Initiated:  01/29/2011  Documentation initiated by:  Lashanta Elbe  Subjective/Objective Assessment:   66 yo female admitted 01/28/11 with osteoarthritis of right hip S/P right hip replacement     Action/Plan:   D/C when medicaly stable   Anticipated DC Date:  02/01/2011   Anticipated DC Plan:  HOME W HOME HEALTH SERVICES      DC Planning Services  CM consult      [facility] Choice  HOME HEALTH  DURABLE MEDICAL EQUIPMENT   Choice offered to / List presented to:  C-1 Patient   DME arranged  3-N-1      DME agency  [facility]     [facility] arranged  HH-2 PT      [facility] agency  [facility]   Status of service:  In process, will continue to follow  Comments:  01/29/11, Kathi Der RNC-MNN, BSN, [phone], CM received referral.  CM met with pt. who states husband will be at home to assist her upon d/c.  CC presented choice for [facility] services and pt stated that she will be using [place] for [facility] services.  Debbie at [place] contacted with [facility] orders and confirmation of services received.  Pt. states that she already has a rolling walker at home.

## 2011-01-29 NOTE — Progress Notes (Signed)
Physical Therapy Treatment Patient Details Name: Traci Reed MRN: [id number] DOB: [birth year] Today's Date: 01/29/2011  R THR Dir Anterior WBAT POD #1 pm session 15;20 - 15:45 1gt  1 ta  PT Assessment/Plan  PT - Assessment/Plan Comments on Treatment Session: Pt plans to D/C to home with spouse PT Plan: Discharge plan remains appropriate Follow Up Recommendations: Home health PT PT Goals  Acute Rehab PT Goals PT Goal Formulation: With patient Pt will go Supine/Side to Sit: with min assist PT Goal: Supine/Side to Sit - Progress: Progressing toward goal Pt will go Sit to Supine/Side: with min assist PT Goal: Sit to Supine/Side - Progress: Met Pt will go Sit to Stand: with supervision PT Goal: Sit to Stand - Progress: Progressing toward goal Pt will go Stand to Sit: with modified independence PT Goal: Stand to Sit - Progress: Progressing toward goal Pt will Ambulate: 51 - 150 feet;with supervision;with rolling walker PT Goal: Ambulate - Progress: Partly met Pt will Go Up / Down Stairs: 3-5 stairs;with min assist;with least restrictive assistive device PT Goal: Up/Down Stairs - Progress: Progressing toward goal Pt will Perform Home Exercise Program: with supervision, verbal cues required/provided PT Goal: Perform Home Exercise Program - Progress: Not met  PT Treatment Precautions/Restrictions  Restrictions Weight Bearing Restrictions: Yes RLE Weight Bearing: Weight bearing as tolerated Mobility (including Balance) Bed Mobility Bed Mobility: Yes Sit to Supine: 4: Min assist Sit to Supine - Details (indicate cue type and reason): increased time and min assist to support R LE up bed Transfers Transfers: Yes Sit to Stand: 4: Min assist;From chair/3-in-1 Sit to Stand Details (indicate cue type and reason): <25% VC's on hand placement and increased time Stand to Sit: 5: Supervision;To bed Stand to Sit Details: one VC on turn completion and hand placement prior to  sit Ambulation/Gait Ambulation/Gait: Yes Ambulation/Gait Assistance: Other (comment) (MinGuard assist) Ambulation/Gait Assistance Details (indicate cue type and reason): increased time and one VC on proper posture and safety with turns Ambulation Distance (Feet): 250 Feet Assistive device: Rolling walker Gait Pattern: Step-through pattern;Decreased stance time - right;Trunk flexed Stairs: No Wheelchair Mobility Wheelchair Mobility: No    Exercise    End of Session PT - End of Session Equipment Utilized During Treatment: Gait belt Activity Tolerance: Patient tolerated treatment well Patient left: in bed;with call bell in reach General Behavior During Session: [facility] for tasks performed Cognition: [facility] for tasks performed  Felecia Shelling  PTA [facility]  Acute  Rehab Pager     [phone]

## 2011-01-29 NOTE — Progress Notes (Addendum)
Subjective: 1 Day Post-Op Procedure(s) (LRB): TOTAL HIP ARTHROPLASTY ANTERIOR APPROACH (Right)   Patient reports pain as mild. No events.  Objective:   VITALS:   Filed Vitals:   01/29/11 0548  BP: 113/74  Pulse: 73  Temp: 98.1 F (36.7 C)  Resp: 18    Neurovascular intact Dorsiflexion/Plantar flexion intact Incision: dressing C/D/I No cellulitis present Compartment soft  LABS  Basename 01/29/11 0355  HGB 9.7*  HCT 29.4*  WBC 8.8  PLT 168     Basename 01/29/11 0355  NA 135  K 4.0  BUN 8  CREATININE 0.61  GLUCOSE 166*     Assessment/Plan: 1 Day Post-Op Procedure(s) (LRB): TOTAL HIP ARTHROPLASTY ANTERIOR APPROACH (Right)  Foley cath d/c'ed HV drain d/c'ed Advance diet Up with therapy D/C IV fluids Discharge home with home health Saturday if continues to do well     Anastasio Auerbach. Anairis Knick   PAC  01/29/2011, 8:06 AM

## 2011-01-29 NOTE — Progress Notes (Signed)
Utilization review completed.  

## 2011-01-29 NOTE — Evaluation (Signed)
Occupational Therapy Evaluation Patient Details Name: Traci Reed MRN: [id number] DOB: Dec 25, 1945 Today's Date: 01/29/2011  Problem List:  Patient Active Problem List  Diagnoses  . S/P right hip replacement    Past Medical History:  Past Medical History  Diagnosis Date  . Hypertension   . Arthritis   . Diabetes mellitus   . Anemia    Past Surgical History:  Past Surgical History  Procedure Date  . Biopsy thyroid 02/2010  . Tonsillectomy   . Cervical disk 1985  . Breast surgery     right breast-benign  . Abdominal hysterectomy 1991    complete  . Carpal tunnel release 1978    right  . Heel spur excision 1994    left  . Joint replacement 2001    right  . Hernia repair 2006    abdominal    OT Assessment/Plan/Recommendation OT Assessment Clinical Impression Statement: Pt presents w/ decreased independence in areas of lower body ADL's and selfcare tasks as well as functional mobility. She has A/E @ home & plans to have husband assist PRN @ D/C. She will need 3:1 OT Recommendation/Assessment: Patient will need skilled OT in the acute care venue OT Problem List: Decreased knowledge of use of DME or AE;Pain OT Therapy Diagnosis : Generalized weakness OT Plan OT Frequency: Min 2X/week OT Treatment/Interventions: Self-care/ADL training;Therapeutic activities;Energy conservation OT Recommendation Follow Up Recommendations: No OT follow up Equipment Recommended: 3 in 1 bedside comode Individuals Consulted Consulted and Agree with Results and Recommendations: Patient OT Goals Acute Rehab OT Goals OT Goal Formulation: With patient Time For Goal Achievement: 5 days ADL Goals Pt Will Perform Lower Body Bathing: with supervision;with min assist;with adaptive equipment;Sit to stand from chair;Sit to stand from bed ADL Goal: Lower Body Bathing - Progress: Goal set today Pt Will Perform Lower Body Dressing: with supervision;with min assist;with adaptive equipment;Sit to  stand from chair;Sit to stand from bed ADL Goal: Lower Body Dressing - Progress: Goal set today Pt Will Perform Tub/Shower Transfer: Shower transfer;with supervision;Shower seat with back;with DME;Ambulation ADL Goal: Tub/Shower Transfer - Progress: Goal set today  OT Evaluation Precautions/Restrictions  Restrictions Weight Bearing Restrictions: Yes RLE Weight Bearing: Weight bearing as tolerated Prior Functioning Home Living Lives With: Spouse Receives Help From: Family Type of Home: House Home Layout: One level Home Access: Stairs to enter Entrance Stairs-Rails: Doctor, general practice of Steps: 4 Bathroom Shower/Tub: Health visitor: Standard Bathroom Accessibility: Yes How Accessible: Accessible via walker Home Adaptive Equipment: Straight cane;Walker - rolling;Walker - standard;Built-in shower seat;Grab bars in shower Prior Function Level of Independence: Independent with homemaking with ambulation;Independent with gait;Independent with transfers;Independent with basic ADLs Driving: Yes Vocation: Retired Comments: Runner, broadcasting/film/video ADL ADL Eating/Feeding: Performed;Modified independent Where Assessed - Eating/Feeding: Bed level;Chair Grooming: Performed;Modified independent Where Assessed - Grooming: Sitting, chair Upper Body Bathing: Simulated;Modified independent;Set up Upper Body Bathing Details (indicate cue type and reason): set up to gather items for pt Where Assessed - Upper Body Bathing: Sitting, chair Lower Body Bathing: Simulated;Minimal assistance Where Assessed - Lower Body Bathing: Sitting, chair Upper Body Dressing: Simulated;Modified independent Where Assessed - Upper Body Dressing: Sitting, bed Lower Body Dressing: Simulated;Minimal assistance Where Assessed - Lower Body Dressing: Sit to stand from bed;Sit to stand from chair;Unsupported Toilet Transfer: Performed;Supervision/safety Toilet Transfer Method: Network engineer: Bedside commode Toileting - Clothing Manipulation: Performed;Supervision/safety Where Assessed - Glass blower/designer Manipulation: Standing Toileting - Hygiene: Performed;Modified independent Where Assessed - Toileting Hygiene: Sit on 3-in-1 or toilet;Sit to stand from  3-in-1 or toilet Tub/Shower Transfer: Not assessed Equipment Used: Rolling walker;Other (comment) (3:1, pt ed A/E & hand out issued, has A/E) Ambulation Related to ADLs: Supervision for safety and cues ADL Comments: Pt presents w/ decreased independence in areas of lower body ADL's and selfcare tasks as well as functional mobility. She has A/E @ home & plans to have husband assist PRN @ D/C. She will need 3:1 Vision/Perception  Vision - History Baseline Vision: Wears glasses all the time Patient Visual Report: No change from baseline Cognition Cognition Arousal/Alertness: Awake/alert Overall Cognitive Status: Appears within functional limits for tasks assessed Orientation Level: Oriented X4 Sensation/Coordination Sensation Light Touch: Appears Intact Coordination Gross Motor Movements are Fluid and Coordinated: Yes Fine Motor Movements are Fluid and Coordinated: Yes Extremity Assessment RUE Assessment RUE Assessment: Within Functional Limits LUE Assessment LUE Assessment: Within Functional Limits Mobility  Bed Mobility Bed Mobility: Yes Supine to Sit: 4: Min assist Supine to Sit Details (indicate cue type and reason): R LE assist  Sitting - Scoot to Edge of Bed: 5: Supervision Transfers Transfers: Yes Sit to Stand: 4: Min assist;From bed;From chair/3-in-1 Sit to Stand Details (indicate cue type and reason): VC's for hand placement and safety/sequencing w/ RW Stand to Sit: 5: Supervision;4: Min assist;To chair/3-in-1 Stand to Sit Details: cues for hands End of Session OT - End of Session Equipment Utilized During Treatment: Gait belt;Other (comment) (RW; 3:1) Activity Tolerance: Patient tolerated  treatment well Patient left: in chair;with call bell in reach General Behavior During Session: [facility] for tasks performed Cognition: [facility] for tasks performed   Alm Bustard 01/29/2011, 11:01 AM

## 2011-01-29 NOTE — Evaluation (Signed)
Physical Therapy Evaluation Patient Details Name: Traci Reed MRN: [id number] DOB: 08-03-1945 Today's Date: 01/29/2011 Ev 2 Problem List:  Patient Active Problem List  Diagnoses  . S/P right hip replacement    Past Medical History:  Past Medical History  Diagnosis Date  . Hypertension   . Arthritis   . Diabetes mellitus   . Anemia    Past Surgical History:  Past Surgical History  Procedure Date  . Biopsy thyroid 02/2010  . Tonsillectomy   . Cervical disk 1985  . Breast surgery     right breast-benign  . Abdominal hysterectomy 1991    complete  . Carpal tunnel release 1978    right  . Heel spur excision 1994    left  . Joint replacement 2001    right  . Hernia repair 2006    abdominal    PT Assessment/Plan/Recommendation PT Assessment Clinical Impression Statement: pt will benefit from PT to maximize independence for home setting; pt hopeful to D/C home next few days PT Recommendation/Assessment: Patient will need skilled PT in the acute care venue PT Problem List: Decreased strength;Decreased range of motion;Decreased activity tolerance;Decreased mobility;Decreased knowledge of use of DME;Pain PT Therapy Diagnosis : Difficulty walking PT Plan PT Frequency: 7X/week PT Treatment/Interventions: DME instruction;Gait training;Functional mobility training;Therapeutic activities;Therapeutic exercise;Patient/family education;Stair training PT Recommendation Follow Up Recommendations: Home health PT Equipment Recommended: None recommended by PT PT Goals  Acute Rehab PT Goals PT Goal Formulation: With patient Time For Goal Achievement: 5 days Pt will go Supine/Side to Sit: with min assist PT Goal: Supine/Side to Sit - Progress: Goal set today Pt will go Sit to Supine/Side: with min assist PT Goal: Sit to Supine/Side - Progress: Goal set today Pt will go Sit to Stand: with supervision;with modified independence PT Goal: Sit to Stand - Progress: Goal set today Pt  will go Stand to Sit: with modified independence;with supervision PT Goal: Stand to Sit - Progress: Goal set today Pt will Ambulate: 51 - 150 feet;with supervision;with rolling walker PT Goal: Ambulate - Progress: Goal set today Pt will Go Up / Down Stairs: 3-5 stairs;with min assist;with rail(s);with least restrictive assistive device PT Goal: Up/Down Stairs - Progress: Goal set today Pt will Perform Home Exercise Program: with supervision, verbal cues required/provided PT Goal: Perform Home Exercise Program - Progress: Goal set today  PT Evaluation Precautions/Restrictions  Restrictions Weight Bearing Restrictions: Yes RLE Weight Bearing: Weight bearing as tolerated Prior Functioning  Home Living Lives With: Spouse Receives Help From: Family Type of Home: House Home Layout: One level Home Access: Stairs to enter Entrance Stairs-Rails: Doctor, general practice of Steps: 4 Bathroom Shower/Tub: Health visitor: Standard Bathroom Accessibility: Yes How Accessible: Accessible via walker Home Adaptive Equipment: Straight cane;Walker - rolling;Walker - standard;Built-in shower seat;Grab bars in shower Prior Function Level of Independence: Independent with homemaking with ambulation;Independent with gait;Independent with transfers;Independent with basic ADLs Driving: Yes Vocation: Retired Comments: Designer, jewellery Arousal/Alertness: Awake/alert Overall Cognitive Status: Appears within functional limits for tasks assessed Orientation Level: Oriented X4 Sensation/Coordination Sensation Light Touch: Appears Intact Extremity Assessment RLE Assessment RLE Assessment:  (ankle WFL; knee/hip >3/5) LLE Assessment LLE Assessment: Within Functional Limits Mobility (including Balance) Bed Mobility Bed Mobility: No Transfers Sit to Stand: 4: Min assist;From chair/3-in-1;With armrests Sit to Stand Details (indicate cue type and reason): cues for  hands Stand to Sit: 4: Min assist;To chair/3-in-1;With armrests Stand to Sit Details: cues for hands Ambulation/Gait Ambulation/Gait Assistance: 4: Min assist Ambulation/Gait Assistance Details (indicate  cue type and reason): cues for sequence, min for balance and safety, 2 standing rests due to nausea; RN aware and to give addidtional meds Ambulation Distance (Feet): 50 Feet Assistive device: Rolling walker    Exercise  Total Joint Exercises Ankle Circles/Pumps: AROM;Both;10 reps Quad Sets: AROM;Both;10 reps Heel Slides: AAROM;Right;10 reps End of Session PT - End of Session Equipment Utilized During Treatment: Gait belt Activity Tolerance: Other (comment);Patient tolerated treatment well (limited by nausea) Patient left: in chair Nurse Communication: Mobility status for transfers;Mobility status for ambulation General Behavior During Session: [facility] for tasks performed Cognition: [facility] for tasks performed  [facility] 01/29/2011, 10:54 AM

## 2011-01-30 LAB — BASIC METABOLIC PANEL
Calcium: 8.6 mg/dL (ref 8.4–10.5)
GFR calc Af Amer: >90 mL/min (ref >90)
GFR calc non Af Amer: 89 mL/min — ABNORMAL LOW (ref >90)
Potassium: 3.6 mEq/L (ref 3.5–5.1)
Sodium: 136 mEq/L (ref 135–145)

## 2011-01-30 LAB — GLUCOSE, CAPILLARY
Glucose-Capillary: 143 mg/dL — ABNORMAL HIGH (ref 70–99)
Glucose-Capillary: 156 mg/dL — ABNORMAL HIGH (ref 70–99)

## 2011-01-30 LAB — CBC
MCH: 27 pg (ref 26.0–34.0)
MCHC: 32.2 g/dL (ref 30.0–36.0)
Platelets: 142 10*3/uL — ABNORMAL LOW (ref 150–400)
RBC: 3.15 MIL/uL — ABNORMAL LOW (ref 3.87–5.11)

## 2011-01-30 NOTE — Progress Notes (Signed)
Occupational Therapy Treatment Patient Details Name: ZIONAH CRISWELL MRN: [id number] DOB: 26-Dec-1945 Today's Date: 01/30/2011 1018 1042 2 [place] OT Assessment/Plan OT Assessment/Plan Follow Up Recommendations: No OT follow up Equipment Recommended: 3 in 1 bedside comode OT Goals ADL Goals Pt Will Perform Lower Body Bathing: with supervision;with min assist;with adaptive equipment;Sit to stand from chair;Sit to stand from bed ADL Goal: Lower Body Bathing - Progress: Met Pt Will Perform Lower Body Dressing: with supervision;with min assist;with adaptive equipment;Sit to stand from chair;Sit to stand from bed ADL Goal: Lower Body Dressing - Progress: Met Pt Will Perform Tub/Shower Transfer: Shower transfer;with supervision;Shower seat with back;with DME;Ambulation ADL Goal: Tub/Shower Transfer - Progress: Met  OT Treatment Precautions/Restrictions  Restrictions Weight Bearing Restrictions: Yes RLE Weight Bearing: Weight bearing as tolerated   ADL ADL Lower Body Bathing: Performed;Supervision/safety (with reacher) Where Assessed - Lower Body Bathing: Sit to stand from chair Lower Body Dressing: Performed;Supervision/safety (with reacher and sock aid) Where Assessed - Lower Body Dressing: Sit to stand from chair Tub/Shower Transfer: Performed;Supervision/safety Tub/Shower Transfer Method: Ambulating (walk in shower) Tub/Shower Transfer Equipment: Walk in shower Equipment Used: Reacher;Rolling walker;Sock aid Ambulation Related to ADLs: supervision.  No cues needed Mobility  Bed Mobility Bed Mobility: No (pt up in chair with nursing) Transfers Sit to Stand: 5: Supervision;With upper extremity assist;From chair/3-in-1 Sit to Stand Details (indicate cue type and reason): Cues to engage core and keep trunk extended Stand to Sit: 5: Supervision;To chair/3-in-1 Exercises   End of Session OT - End of Session Activity Tolerance: Patient tolerated treatment well Patient left: in  chair;with call bell in reach General Behavior During Session: [facility] for tasks performed Cognition: [facility] for tasks performed [facility], OTR/L [phone] 01/30/2011 Araceli Arango  01/30/2011, 11:42 AM

## 2011-01-30 NOTE — Progress Notes (Signed)
Patient ID: Traci Reed, female   DOB: 1945/02/12, 66 y.o.   MRN: [id number] Subjective: 2 Days Post-Op Procedure(s) (LRB): TOTAL HIP ARTHROPLASTY ANTERIOR APPROACH (Right)    Patient reports pain as mild.  Had problems with nausea yesterday but feel that it is better at least this am Worried about travel to [facility]  Objective:   VITALS:   Filed Vitals:   01/30/11 0610  BP: 128/72  Pulse: 78  Temp: 98 F (36.7 C)  Resp: 18    Neurovascular intact Incision: dressing C/D/I  LABS  Basename 01/30/11 0346 01/29/11 0355  HGB 8.5* 9.7*  HCT 26.4* 29.4*  WBC 7.5 8.8  PLT 142* 168     Basename 01/30/11 0346 01/29/11 0355  NA 136 135  K 3.6 4.0  BUN 9 8  CREATININE 0.68 0.61  GLUCOSE 135* 166*    No results found for this basename: LABPT:2,INR:2 in the last 72 hours   Assessment/Plan: 2 Days Post-Op Procedure(s) (LRB): TOTAL HIP ARTHROPLASTY ANTERIOR APPROACH (Right)   Advance diet Up with therapy Plan for discharge tomorrow  Due to icing weather conditions and help from family I feel that is safest for her to remain here due to high risk of fall around her home.  Will see if nausea is improved today and change meds if needed prior to D/C tomorrow.

## 2011-01-30 NOTE — Progress Notes (Signed)
Physical Therapy Treatment Patient Details Name: Traci Reed MRN: [id number] DOB: 15-Jul-1945 Today's Date: 01/30/2011 1430-1455 GE  PT Assessment/Plan  PT - Assessment/Plan Comments on Treatment Session: Pt plans to D/C to home with spouse, verbally reviewed car transfers, pt acknowledges and states she is ready to go home with no further acute PT PT Plan: Discharge plan remains appropriate Follow Up Recommendations: Home health PT Equipment Recommended: 3 in 1 bedside comode PT Goals  Acute Rehab PT Goals Pt will go Supine/Side to Sit: with min assist PT Goal: Supine/Side to Sit - Progress: Met (per pt) Pt will go Sit to Supine/Side: with min assist PT Goal: Sit to Supine/Side - Progress: Met (per pt) Pt will go Sit to Stand: with supervision PT Goal: Sit to Stand - Progress: Met Pt will go Stand to Sit: with modified independence PT Goal: Stand to Sit - Progress: Met Pt will Ambulate: 51 - 150 feet;with supervision;with rolling walker PT Goal: Ambulate - Progress: Met Pt will Go Up / Down Stairs: 3-5 stairs;with min assist;with least restrictive assistive device PT Goal: Up/Down Stairs - Progress: Met Pt will Perform Home Exercise Program: with supervision, verbal cues required/provided PT Goal: Perform Home Exercise Program - Progress: Partly met  PT Treatment Precautions/Restrictions  Restrictions Weight Bearing Restrictions: Yes RLE Weight Bearing: Weight bearing as tolerated Mobility (including Balance) Bed Mobility Bed Mobility: No (pt up in chair, she says she does not need to practice) Supine to Sit: 4: Min assist Supine to Sit Details (indicate cue type and reason): pt states she needs a little help but will have it at home "its basically the same as I did with my knee" Transfers Transfers: Yes Sit to Stand: 6: Modified independent (Device/Increase time);From chair/3-in-1;With upper extremity assist Stand to Sit: 6: Modified independent (Device/Increase  time) Ambulation/Gait Ambulation/Gait: Yes Ambulation/Gait Assistance: 6: Modified independent (Device/Increase time) Ambulation/Gait Assistance Details (indicate cue type and reason): better posture this pm,  able to flex hip more to step with right leg Ambulation Distance (Feet): 250 Feet Assistive device: Rolling walker Gait Pattern: Step-through pattern Gait velocity: slow Wheelchair Mobility Wheelchair Mobility: No  Balance Balance Assessed: No Exercise  Total Joint Exercises Gluteal Sets: AROM;Both;5 reps;Seated Hip ABduction/ADduction: AROM;Right;5 reps;Standing Long Arc Quad: 10 reps;Right;Seated;AROM Knee Flexion: AROM;10 reps;Standing;Right Marching in Standing: AROM;5 reps;Standing Standing Hip Extension: Standing;5 reps;AROM;Other (comment) (worked on weight shifting,extending hip over fixed right foo) End of Session PT - End of Session Equipment Utilized During Treatment:  (RW) Activity Tolerance: Patient tolerated treatment well Patient left: in chair;with call bell in reach General Behavior During Session: [facility] for tasks performed Cognition: [facility] for tasks performed  Donnetta Hail 01/30/2011, 3:16 PM

## 2011-01-31 MED ORDER — ACETAMINOPHEN 325 MG PO TABS
650.0000 mg | ORAL_TABLET | Freq: Once | ORAL | Status: AC
Start: 2011-01-31 — End: 2011-01-31
  Administered 2011-01-31: 650 mg via ORAL

## 2011-01-31 NOTE — Progress Notes (Signed)
Patients temp 101.6.  Is given and instructed, pt demonstrated proper use. Is 1500.  Encouraged use and cough and deep breath.  Paged Md.  Awaiting response.  0010- Notified Diane Kovach, PA of temp.  Order given for one time dose of tylenol per md order.  No further orders at this time.

## 2011-01-31 NOTE — Progress Notes (Signed)
Subjective: 3 Days Post-Op Procedure(s) (LRB): TOTAL HIP ARTHROPLASTY ANTERIOR APPROACH (Right) Patient reports pain as mild.   Patient seen in rounds with Dr. Darrelyn Hillock. Patient has no complaints this morning. She states that her nausea is improved and her pain is well-controlled. She is ready to go home today. She denies shortness of breath and chest pain.   Objective: Vital signs in last 24 hours: Temp:  [98.1 F (36.7 C)-101.6 F (38.7 C)] 98.1 F (36.7 C) (01/27 0510) Pulse Rate:  [84-93] 84  (01/27 0510) Resp:  [18] 18  (01/27 0510) BP: (99-109)/(64-68) 105/64 mmHg (01/27 0510) SpO2:  [96 %-100 %] 100 % (01/27 0510)  Intake/Output from previous day:  Intake/Output Summary (Last 24 hours) at 01/31/11 0905 Last data filed at 01/30/11 2300  Gross per 24 hour  Intake    770 ml  Output    650 ml  Net    120 ml     Labs:  Basename 01/30/11 0346 01/29/11 0355  HGB 8.5* 9.7*    Basename 01/30/11 0346 01/29/11 0355  WBC 7.5 8.8  RBC 3.15* 3.54*  HCT 26.4* 29.4*  PLT 142* 168    Basename 01/30/11 0346 01/29/11 0355  NA 136 135  K 3.6 4.0  CL 102 102  CO2 28 25  BUN 9 8  CREATININE 0.68 0.61  GLUCOSE 135* 166*  CALCIUM 8.6 8.6    Exam - Neurologically intact Neurovascular intact Dorsiflexion/Plantar flexion intact No cellulitis present Compartment soft Dressing/Incision - clean, dry, no drainage Motor function intact - moving foot and toes well on exam.   Past Medical History  Diagnosis Date  . Hypertension   . Arthritis   . Diabetes mellitus   . Anemia     Assessment/Plan: 3 Days Post-Op Procedure(s) (LRB): TOTAL HIP ARTHROPLASTY ANTERIOR APPROACH (Right) Principal Problem:  *S/P right hip replacement   Advance diet Up with therapy Discharge home with home health today F/U with Dr. Charlann Boxer in office as discussed  Eulas Schweitzer Leotis Shames 01/31/2011, 9:05 AM

## 2011-02-01 ENCOUNTER — Encounter (HOSPITAL_COMMUNITY): Payer: Self-pay | Admitting: Orthopedic Surgery

## 2011-02-01 NOTE — Discharge Summary (Signed)
Physician Discharge Summary  Patient ID: Traci Reed MRN: [id number] DOB/AGE: Jul 31, 1945 66 y.o.  Admit date: 01/28/2011 Discharge date: 01/31/2011  Procedures:  Procedure(s) (LRB): TOTAL HIP ARTHROPLASTY ANTERIOR APPROACH (Right)  Attending Physician: Dr. Durene Romans  Admission Diagnoses: Right hip OA and pain   Discharge Diagnoses:  Principal Problem:  *S/P right hip replacement  Hypertension  Arthritis   Diabetes mellitus   Anemia   HPI: Pt is a 66 y.o. female complaining of right hip pain since 2011. Pain had continually increased since the beginning, significantly increased and started to really effect her life for the last 6 months. X-rays in the clinic show end-stage arthritic changes of the right hip. Pt has tried various conservative treatments which have failed to alleviate their symptoms, including NSAIDs, PT and steroid injections. Various options are discussed with the patient. Risks, benefits and expectations were discussed with the patient. Patient understand the risks, benefits and expectations and wishes to proceed with surgery.   PCP: Carmin Richmond, MD, MD   Discharged Condition: good  Hospital Course:  Patient underwent the above stated procedure on 01/28/2011. Patient tolerated the procedure well and brought to the recovery room in good condition and subsequently to the floor.  POD #1 BP: 113/74 ; Pulse: 73 ; Temp: 98.1 F  ; Resp: 18  Pt's foley was removed, as well as the hemovac drain removed. IV was changed to a saline lock. Patient reports pain as mild. No events. Neurovascular intact, dorsiflexion/plantar flexion intact, incision: dressing C/D/I, no cellulitis present and compartment soft.  LABS  Basename  01/29/11 0355   HGB  9.7*   HCT  29.4*    POD #2  BP: 128/72 ; Pulse: 78 ; Temp: 98 F ; Resp: 18  Patient reports pain as mild. Had problems with nausea yesterday but feel that it is better at least this am. Worried about travel to [facility]. Neurovascular intact and incision: dressing C/D/I  LABS  Basename  01/30/11 0346    HGB  8.5*    HCT  26.4*     POD #3  BP: 105/64 ; Pulse: 84 ; Temp: 98.1 F  ; Resp: 18  Patient has no complaints this morning. She states that her nausea is improved and her pain is well-controlled. She is ready to go home today. Pt discharged home with HHPT.  LABS   No new labs  Discharge Exam: Extremities: Homans sign is negative, no sign of DVT, no edema, redness or tenderness in the calves or thighs and no ulcers, gangrene or trophic changes  Disposition: Home-Health Care Svc with follow up in 2 weeks at [facility]  Discharge Orders    Future Orders Please Complete By Expires   Diet - low sodium heart healthy      Call MD / Call 911      Comments:   If you experience chest pain or shortness of breath, CALL 911 and be transported to the hospital emergency room.  If you develope a fever above 101 F, pus (white drainage) or increased drainage or redness at the wound, or calf pain, call your surgeon's office.   Discharge instructions      Comments:   Maintain surgical dressing for 8 days, then replace with gauze and tape. Keep the area dry and clean until follow up. Follow up in 2 weeks at [facility]. Call with any questions or concerns.     Constipation Prevention      Comments:  Drink plenty of fluids.  Prune juice may be helpful.  You may use a stool softener, such as Colace (over the counter) 100 mg twice a day.  Use MiraLax (over the counter) for constipation as needed.   Increase activity slowly as tolerated      Weight Bearing as taught in Physical Therapy      Comments:   Use a walker or crutches as instructed.   Driving restrictions      Comments:   No driving for 4 weeks   Change dressing      Comments:   Maintain surgical dressing for 8 days, then replace with 4x4 guaze and tape. Keep the area dry and clean.   TED hose      Comments:   Use  stockings (TED hose) for 2 weeks on both leg(s).  You may remove them at night for sleeping.      Discharge Medication List as of 01/31/2011  9:37 AM    START taking these medications   Details  diphenhydrAMINE (BENADRYL) 25 mg capsule Take 1 capsule (25 mg total) by mouth every 6 (six) hours as needed for itching, allergies or sleep., Starting 01/29/2011, Until Mon 02/08/11, No Print    docusate sodium 100 MG CAPS Take 100 mg by mouth 2 (two) times daily., Starting 01/29/2011, Until Mon 02/08/11, No Print    ferrous sulfate 325 (65 FE) MG tablet Take 1 tablet (325 mg total) by mouth 3 (three) times daily after meals., Starting 01/29/2011, Until Sat 01/29/12, No Print    HYDROcodone-acetaminophen (NORCO) 7.5-325 MG per tablet Take 1-2 tablets by mouth every 4 (four) hours., Starting 01/29/2011, Until Mon 02/08/11, Print    methocarbamol (ROBAXIN) 500 MG tablet Take 1 tablet (500 mg total) by mouth every 6 (six) hours as needed (muscle spasms)., Starting 01/29/2011, Until Mon 02/08/11, No Print    polyethylene glycol (MIRALAX / GLYCOLAX) packet Take 17 g by mouth 2 (two) times daily., Starting 01/29/2011, Until Mon 02/01/11, No Print      CONTINUE these medications which have CHANGED   Details  aspirin EC 325 MG tablet Take 1 tablet (325 mg total) by mouth 2 (two) times daily. X 4 weeks, Starting 01/29/2011, Until Mon 02/08/11, No Print      CONTINUE these medications which have NOT CHANGED   Details  Calcium Carbonate-Vit D-Min 1200-1000 MG-UNIT CHEW Chew 1 tablet by mouth 2 (two) times daily., Until Discontinued, Historical Med    CINNAMON PO Take 1 tablet by mouth 2 (two) times daily., Until Discontinued, Historical Med    GLUCOSAMINE-CHONDROITIN PO Take 1 tablet by mouth 2 (two) times daily., Until Discontinued, Historical Med    metFORMIN (GLUCOPHAGE) 500 MG tablet Take 500-750 mg by mouth 2 (two) times daily. 1.5 tablet in the morning and 1 tablet in the evening, Until Discontinued, Historical Med     Multiple Vitamin (MULITIVITAMIN WITH MINERALS) TABS Take 1 tablet by mouth daily before breakfast., Until Discontinued, Historical Med    simvastatin (ZOCOR) 40 MG tablet Take 40 mg by mouth every evening., Until Discontinued, Historical Med      STOP taking these medications     cefUROXime (CEFTIN) 500 MG tablet Comments:  Reason for Stopping:       meloxicam (MOBIC) 7.5 MG tablet Comments:  Reason for Stopping:            Signed: Anastasio Auerbach. Monica Zahler   PAC  02/01/2011, 9:38 AM

## 2011-02-03 ENCOUNTER — Other Ambulatory Visit (HOSPITAL_COMMUNITY): Payer: Self-pay | Admitting: Orthopedic Surgery

## 2011-02-03 DIAGNOSIS — M169 Osteoarthritis of hip, unspecified: Secondary | ICD-10-CM

## 2012-10-15 IMAGING — CR DG PORTABLE PELVIS
1 series · 1 of 1 positions shown · non-contrast
Comparison: 11/12/2010

CLINICAL DATA: Right hip DJD.

PORTABLE PELVIS

[AP]
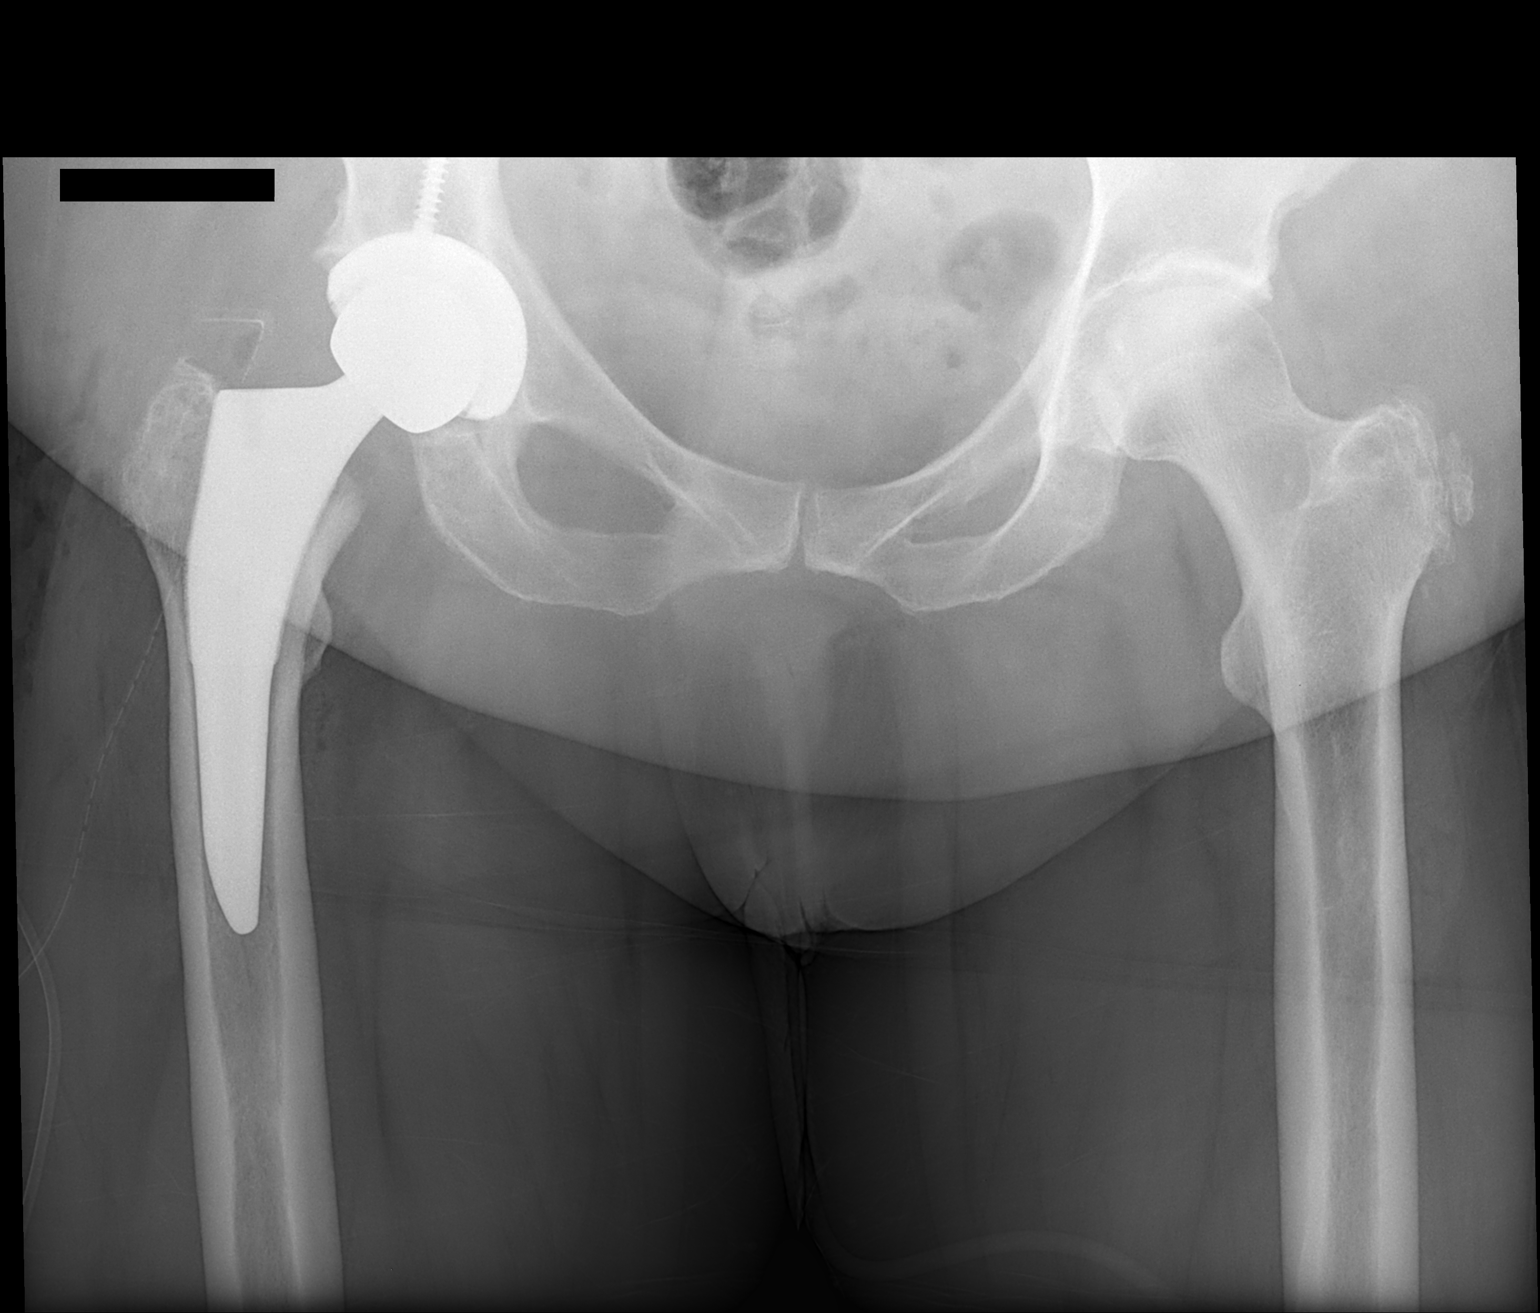

[1 of 1 positions shown; findings below may reference images not displayed]

FINDINGS: Postop right T H R.  Satisfactory position and alignment.
Surgical drain good position.
IMPRESSION: As above.

## 2013-02-01 ENCOUNTER — Encounter (HOSPITAL_COMMUNITY): Payer: Self-pay | Admitting: Pharmacy Technician

## 2013-02-02 ENCOUNTER — Encounter (HOSPITAL_COMMUNITY)
Admission: RE | Admit: 2013-02-02 | Discharge: 2013-02-02 | Disposition: A | Discharge status: Home / Self Care | Payer: Medicare Other | Source: Ambulatory Visit | Attending: Orthopedic Surgery | Admitting: Orthopedic Surgery

## 2013-02-02 ENCOUNTER — Encounter (HOSPITAL_COMMUNITY): Payer: Self-pay

## 2013-02-02 ENCOUNTER — Ambulatory Visit (HOSPITAL_COMMUNITY)
Admission: RE | Admit: 2013-02-02 | Discharge: 2013-02-02 | Disposition: A | Discharge status: Home / Self Care | Payer: Medicare Other | Source: Ambulatory Visit | Attending: Orthopedic Surgery | Admitting: Orthopedic Surgery

## 2013-02-02 DIAGNOSIS — Z01812 Encounter for preprocedural laboratory examination: Secondary | ICD-10-CM | POA: Insufficient documentation

## 2013-02-02 DIAGNOSIS — Z01818 Encounter for other preprocedural examination: Secondary | ICD-10-CM | POA: Insufficient documentation

## 2013-02-02 DIAGNOSIS — Z01810 Encounter for preprocedural cardiovascular examination: Secondary | ICD-10-CM | POA: Insufficient documentation

## 2013-02-02 DIAGNOSIS — Z87891 Personal history of nicotine dependence: Secondary | ICD-10-CM | POA: Insufficient documentation

## 2013-02-02 DIAGNOSIS — E119 Type 2 diabetes mellitus without complications: Secondary | ICD-10-CM | POA: Insufficient documentation

## 2013-02-02 HISTORY — DX: Nausea with vomiting, unspecified: R11.2

## 2013-02-02 HISTORY — DX: Other specified postprocedural states: Z98.890

## 2013-02-02 LAB — CBC
HCT: 36.6 % (ref 36.0–46.0)
HEMOGLOBIN: 12.1 g/dL (ref 12.0–15.0)
MCH: 27.6 pg (ref 26.0–34.0)
MCHC: 33.1 g/dL (ref 30.0–36.0)
MCV: 83.4 fL (ref 78.0–100.0)
PLATELETS: 203 10*3/uL (ref 150–400)
RBC: 4.39 MIL/uL (ref 3.87–5.11)
RDW: 13.1 % (ref 11.5–15.5)
WBC: 5.3 10*3/uL (ref 4.0–10.5)

## 2013-02-02 LAB — URINALYSIS, ROUTINE W REFLEX MICROSCOPIC
BILIRUBIN URINE: NEGATIVE
Glucose, UA: 500 mg/dL — AB
HGB URINE DIPSTICK: NEGATIVE
Ketones, ur: NEGATIVE mg/dL
Leukocytes, UA: NEGATIVE
Nitrite: NEGATIVE
Protein, ur: NEGATIVE mg/dL
Specific Gravity, Urine: 1.022 (ref 1.005–1.030)
UROBILINOGEN UA: 0.2 mg/dL (ref 0.0–1.0)
pH: 5 (ref 5.0–8.0)

## 2013-02-02 LAB — PROTIME-INR
INR: 0.82 (ref 0.00–1.49)
Prothrombin Time: 11.2 seconds — ABNORMAL LOW (ref 11.6–15.2)

## 2013-02-02 LAB — BASIC METABOLIC PANEL
BUN: 14 mg/dL (ref 6–23)
CO2: 26 meq/L (ref 19–32)
Calcium: 9.9 mg/dL (ref 8.4–10.5)
Chloride: 103 mEq/L (ref 96–112)
Creatinine, Ser: 0.69 mg/dL (ref 0.50–1.10)
GFR calc Af Amer: >90 mL/min (ref >90)
GFR, EST NON AFRICAN AMERICAN: 87 mL/min — AB (ref >90)
Glucose, Bld: 206 mg/dL — ABNORMAL HIGH (ref 70–99)
POTASSIUM: 4.3 meq/L (ref 3.7–5.3)
SODIUM: 141 meq/L (ref 137–147)

## 2013-02-02 LAB — SURGICAL PCR SCREEN
MRSA, PCR: NEGATIVE
Staphylococcus aureus: NEGATIVE

## 2013-02-02 LAB — APTT: APTT: 25 s (ref 24–37)

## 2013-02-02 NOTE — Pre-Procedure Instructions (Signed)
EKG AND CXR WERE DONE TODAY - [facility]. NOTE OF MEDICAL CLEARANCE FOR PT'S KNEE REPLACEMENT SURGERY IS ON CHART.

## 2013-02-02 NOTE — Patient Instructions (Signed)
PER [facility] FLU POLICY  - NO VISITORS UNDER 218 YEARS OF AGE.   [facility]  ON:   Tuesday  2/3  REPORT TO  SHORT STAY CENTER AT:   7:30 AM      PHONE # FOR SHORT STAY IS [phone]  DO NOT EAT OR DRINK ANYTHING AFTER MIDNIGHT THE NIGHT BEFORE YOUR SURGERY.  YOU MAY BRUSH YOUR TEETH, RINSE OUT YOUR MOUTH--BUT NO WATER, NO FOOD, NO CHEWING GUM, NO MINTS, NO CANDIES, NO CHEWING TOBACCO.  PLEASE TAKE THE FOLLOWING MEDICATIONS THE AM OF YOUR SURGERY WITH A FEW SIPS OF WATER:  MAY USE YOUR FLONASE   IF YOU ARE DIABETIC:  DO NOT TAKE ANY DIABETIC MEDICATIONS THE AM OF YOUR SURGERY.     DO NOT BRING VALUABLES, MONEY, CREDIT CARDS.  DO NOT WEAR JEWELRY, MAKE-UP, NAIL POLISH AND NO METAL PINS OR CLIPS IN YOUR HAIR. CONTACT LENS, DENTURES / PARTIALS, GLASSES SHOULD NOT BE WORN TO SURGERY AND IN MOST CASES-HEARING AIDS WILL NEED TO BE REMOVED.  BRING YOUR GLASSES CASE, ANY EQUIPMENT NEEDED FOR YOUR CONTACT LENS. FOR PATIENTS ADMITTED TO THE HOSPITAL--CHECK OUT TIME THE DAY OF DISCHARGE IS 11:00 AM.  ALL INPATIENT ROOMS ARE PRIVATE - WITH BATHROOM, TELEPHONE, TELEVISION AND WIFI INTERNET.                                                     PLEASE READ OVER ANY  FACT SHEETS THAT YOU WERE GIVEN: MRSA INFORMATION, BLOOD TRANSFUSION INFORMATION, INCENTIVE SPIROMETER INFORMATION.  FAILURE TO FOLLOW THESE INSTRUCTIONS MAY RESULT IN THE CANCELLATION OF YOUR SURGERY. PLEASE BE AWARE THAT YOU MAY NEED ADDITIONAL BLOOD DRAWN DAY OF YOUR SURGERY  PATIENT SIGNATURE_________________________________

## 2013-02-04 NOTE — H&P (Signed)
TOTAL KNEE ADMISSION H&P  Patient is being admitted for left total knee arthroplasty.  Subjective:  Chief Complaint:    Left knee OA / pain.  HPI: Hadley PenJudith W Lheureux, 68 y.o. female, has a history of pain and functional disability in the left knee due to arthritis and has failed non-surgical conservative treatments for greater than 12 weeks to includeNSAID's and/or analgesics, corticosteriod injections and activity modification.  Onset of symptoms was gradual, starting >10 years ago with gradually worsening course since that time. The patient noted no past surgery on the left knee(s).  Patient currently rates pain in the left knee(s) at 10 out of 10 with activity. Patient has night pain, worsening of pain with activity and weight bearing, pain that interferes with activities of daily living, pain with passive range of motion, crepitus and joint swelling.  Patient has evidence of periarticular osteophytes and joint space narrowing by imaging studies.  There is no active infection.   Risks, benefits and expectations were discussed with the patient.  Risks including but not limited to the risk of anesthesia, blood clots, nerve damage, blood vessel damage, failure of the prosthesis, infection and up to and including death.  Patient understand the risks, benefits and expectations and wishes to proceed with surgery.   D/C Plans:   SNF  Post-op Meds:   No Rx given  Tranexamic Acid:   To be given  Decadron:    Not to be given - DM  FYI:    ASA post-op  Norco post-op   Patient Active Problem List   Diagnosis Date Noted  . S/P right hip replacement 01/28/2011   Past Medical History  Diagnosis Date  . Hypertension   . PONV (postoperative nausea and vomiting)     NAUSEA WITH SOME OF THE EARLIEST SURGERIES - NO PROBLEMS WITH MORE RECENT   . Diabetes mellitus     TYPE II- ON ORAL MEDICATION -NO INSULIN  . Arthritis     PAIN LEFT KNEE AND PT STATES SOME PAIN IN HER HIP AND BACK BECAUSE OF THE WAY SHE  IS WALKING  . Anemia     ONLY AFTER SURGERIES    Past Surgical History  Procedure Laterality Date  . Biopsy thyroid  02/2010  . Tonsillectomy    . Cervical disk  1985  . Breast surgery      right breast-benign  . Abdominal hysterectomy  1991    complete  . Carpal tunnel release  1978    right  . Heel spur excision  1994    left  . Hernia repair  2006    abdominal  . Total hip arthroplasty  01/28/2011    Procedure: TOTAL HIP ARTHROPLASTY ANTERIOR APPROACH;  Surgeon: Shelda PalMatthew D Olin, MD;  Location: [facility];  Service: Orthopedics;  Laterality: Right;  . Joint replacement  2001    right  TOTAL KNEE ARTHROPLASTY    No prescriptions prior to admission   No Known Allergies   History  Substance Use Topics  . Smoking status: Former Smoker -- 1.00 packs/day for 30 years    Types: Cigarettes    Quit date: 01/21/1995  . Smokeless tobacco: Never Used  . Alcohol Use: No    No family history on file.   Review of Systems  Constitutional: Negative.   HENT: Negative.   Eyes: Negative.   Respiratory: Negative.   Cardiovascular: Negative.   Gastrointestinal: Positive for constipation.  Genitourinary: Negative.   Musculoskeletal: Positive for joint pain.  Skin: Negative.  Neurological: Negative.   Endo/Heme/Allergies: Negative.   Psychiatric/Behavioral: Negative.     Objective:  Physical Exam  Constitutional: She is oriented to person, place, and time. She appears well-developed and well-nourished.  HENT:  Head: Normocephalic and atraumatic.  Mouth/Throat: Oropharynx is clear and moist.  Eyes: Pupils are equal, round, and reactive to light.  Neck: Neck supple. No JVD present. No tracheal deviation present. No thyromegaly present.  Cardiovascular: Normal rate, regular rhythm, normal heart sounds and intact distal pulses.   Respiratory: Effort normal and breath sounds normal. No stridor. No respiratory distress. She has no wheezes.  GI: Soft. There is no tenderness. There is no  guarding.  Musculoskeletal:       Left knee: She exhibits decreased range of motion, swelling and bony tenderness. She exhibits no effusion, no ecchymosis, no deformity, no laceration and no erythema. Tenderness found.  Lymphadenopathy:    She has no cervical adenopathy.  Neurological: She is alert and oriented to person, place, and time.  Skin: Skin is warm and dry.  Psychiatric: She has a normal mood and affect.     Labs:  Estimated body mass index is 39.77 kg/(m^2) as calculated from the following:   Height as of 01/28/11: 5\' 5"  (1.651 m).   Weight as of 01/21/11: 108.41 kg (239 lb).   Imaging Review Plain radiographs demonstrate severe degenerative joint disease of the left knee(s). The overall alignment is neutral. The bone quality appears to be good for age and reported activity level.  Assessment/Plan:  End stage arthritis, left knee   The patient history, physical examination, clinical judgment of the provider and imaging studies are consistent with end stage degenerative joint disease of the left knee(s) and total knee arthroplasty is deemed medically necessary. The treatment options including medical management, injection therapy arthroscopy and arthroplasty were discussed at length. The risks and benefits of total knee arthroplasty were presented and reviewed. The risks due to aseptic loosening, infection, stiffness, patella tracking problems, thromboembolic complications and other imponderables were discussed. The patient acknowledged the explanation, agreed to proceed with the plan and consent was signed. Patient is being admitted for inpatient treatment for surgery, pain control, PT, OT, prophylactic antibiotics, VTE prophylaxis, progressive ambulation and ADL's and discharge planning. The patient is planning to be discharged to skilled nursing facility.     Anastasio Auerbach Diego Ulbricht   PAC  02/04/2013, 1:35 PM

## 2013-02-06 ENCOUNTER — Inpatient Hospital Stay (HOSPITAL_COMMUNITY)
Admission: RE | Admit: 2013-02-06 | Discharge: 2013-02-09 | DRG: 470 | Disposition: A | Discharge status: Skilled Nursing Facility | Payer: Medicare Other | Source: Ambulatory Visit | Attending: Orthopedic Surgery | Admitting: Orthopedic Surgery

## 2013-02-06 ENCOUNTER — Inpatient Hospital Stay (HOSPITAL_COMMUNITY): Payer: Medicare Other | Admitting: Anesthesiology

## 2013-02-06 ENCOUNTER — Encounter (HOSPITAL_COMMUNITY)
Admission: RE | Disposition: A | Discharge status: Skilled Nursing Facility | Payer: Self-pay | Source: Ambulatory Visit | Attending: Orthopedic Surgery

## 2013-02-06 ENCOUNTER — Encounter (HOSPITAL_COMMUNITY): Payer: Medicare Other | Admitting: Anesthesiology

## 2013-02-06 ENCOUNTER — Encounter (HOSPITAL_COMMUNITY): Payer: Self-pay | Admitting: *Deleted

## 2013-02-06 DIAGNOSIS — E119 Type 2 diabetes mellitus without complications: Secondary | ICD-10-CM | POA: Diagnosis present

## 2013-02-06 DIAGNOSIS — Z96649 Presence of unspecified artificial hip joint: Secondary | ICD-10-CM

## 2013-02-06 DIAGNOSIS — D62 Acute posthemorrhagic anemia: Secondary | ICD-10-CM | POA: Diagnosis not present

## 2013-02-06 DIAGNOSIS — Z87891 Personal history of nicotine dependence: Secondary | ICD-10-CM

## 2013-02-06 DIAGNOSIS — M6580 Other synovitis and tenosynovitis, unspecified site: Secondary | ICD-10-CM | POA: Diagnosis present

## 2013-02-06 DIAGNOSIS — M1710 Unilateral primary osteoarthritis, unspecified knee: Principal | ICD-10-CM | POA: Diagnosis present

## 2013-02-06 DIAGNOSIS — Z6841 Body Mass Index (BMI) 40.0 and over, adult: Secondary | ICD-10-CM

## 2013-02-06 DIAGNOSIS — I959 Hypotension, unspecified: Secondary | ICD-10-CM | POA: Diagnosis not present

## 2013-02-06 DIAGNOSIS — I10 Essential (primary) hypertension: Secondary | ICD-10-CM | POA: Diagnosis present

## 2013-02-06 DIAGNOSIS — Z96659 Presence of unspecified artificial knee joint: Secondary | ICD-10-CM

## 2013-02-06 DIAGNOSIS — R110 Nausea: Secondary | ICD-10-CM | POA: Diagnosis not present

## 2013-02-06 HISTORY — PX: TOTAL KNEE ARTHROPLASTY: SHX125

## 2013-02-06 LAB — TYPE AND SCREEN
ABO/RH(D): O POS
ANTIBODY SCREEN: NEGATIVE

## 2013-02-06 LAB — GLUCOSE, CAPILLARY
GLUCOSE-CAPILLARY: 142 mg/dL — AB (ref 70–99)
GLUCOSE-CAPILLARY: 218 mg/dL — AB (ref 70–99)
Glucose-Capillary: 151 mg/dL — ABNORMAL HIGH (ref 70–99)
Glucose-Capillary: 114 mg/dL — ABNORMAL HIGH (ref 70–99)
Glucose-Capillary: 105 mg/dL — ABNORMAL HIGH (ref 70–99)

## 2013-02-06 SURGERY — ARTHROPLASTY, KNEE, TOTAL
Anesthesia: Spinal | Site: Knee | Laterality: Left

## 2013-02-06 MED ORDER — ALUM & MAG HYDROXIDE-SIMETH 200-200-20 MG/5ML PO SUSP
30.0000 mL | ORAL | Status: DC | PRN
Start: 2013-02-06 — End: 2013-02-09

## 2013-02-06 MED ORDER — FENTANYL CITRATE 0.05 MG/ML IJ SOLN
INTRAMUSCULAR | Status: DC | PRN
  Administered 2013-02-06: 100 ug via INTRAVENOUS

## 2013-02-06 MED ORDER — BUPIVACAINE LIPOSOME 1.3 % IJ SUSP
20.0000 mL | Freq: Once | INTRAMUSCULAR | Status: DC
  Filled 2013-02-06: qty 20

## 2013-02-06 MED ORDER — FLUTICASONE PROPIONATE 50 MCG/ACT NA SUSP
1.0000 | Freq: Every day | NASAL | Status: DC | PRN
  Filled 2013-02-06: qty 16

## 2013-02-06 MED ORDER — HYDROMORPHONE HCL PF 1 MG/ML IJ SOLN
0.5000 mg | INTRAMUSCULAR | Status: DC | PRN
  Administered 2013-02-06 – 2013-02-07 (×2): 1 mg via INTRAVENOUS
  Filled 2013-02-06 (×2): qty 1

## 2013-02-06 MED ORDER — DOCUSATE SODIUM 100 MG PO CAPS
100.0000 mg | ORAL_CAPSULE | Freq: Two times a day (BID) | ORAL | Status: DC
  Administered 2013-02-06 – 2013-02-09 (×6): 100 mg via ORAL

## 2013-02-06 MED ORDER — POLYETHYLENE GLYCOL 3350 17 G PO PACK
17.0000 g | PACK | Freq: Two times a day (BID) | ORAL | Status: DC
  Administered 2013-02-06 – 2013-02-09 (×6): 17 g via ORAL

## 2013-02-06 MED ORDER — ONDANSETRON HCL 4 MG/2ML IJ SOLN
INTRAMUSCULAR | Status: DC | PRN
  Administered 2013-02-06: 4 mg via INTRAVENOUS

## 2013-02-06 MED ORDER — EPHEDRINE SULFATE 50 MG/ML IJ SOLN
INTRAMUSCULAR | Status: AC
  Filled 2013-02-06: qty 1

## 2013-02-06 MED ORDER — BUPIVACAINE IN DEXTROSE 0.75-8.25 % IT SOLN
INTRATHECAL | Status: DC | PRN
  Administered 2013-02-06: 2 mL via INTRATHECAL

## 2013-02-06 MED ORDER — SODIUM CHLORIDE 0.9 % IV BOLUS (SEPSIS)
500.0000 mL | Freq: Once | INTRAVENOUS | Status: AC
  Administered 2013-02-06: 500 mL via INTRAVENOUS

## 2013-02-06 MED ORDER — MIDAZOLAM HCL 2 MG/2ML IJ SOLN
INTRAMUSCULAR | Status: AC
  Filled 2013-02-06: qty 2

## 2013-02-06 MED ORDER — FLEET ENEMA 7-19 GM/118ML RE ENEM: 1.0000 | ENEMA | Freq: Once | RECTAL | Status: AC | PRN

## 2013-02-06 MED ORDER — KETAMINE HCL 10 MG/ML IJ SOLN
INTRAMUSCULAR | Status: DC | PRN
  Administered 2013-02-06 (×3): 20 mg via INTRAVENOUS

## 2013-02-06 MED ORDER — FERROUS SULFATE 325 (65 FE) MG PO TABS
325.0000 mg | ORAL_TABLET | Freq: Three times a day (TID) | ORAL | Status: DC
  Administered 2013-02-06 – 2013-02-08 (×7): 325 mg via ORAL
  Filled 2013-02-06 (×11): qty 1

## 2013-02-06 MED ORDER — DIPHENHYDRAMINE HCL 25 MG PO CAPS
25.0000 mg | ORAL_CAPSULE | Freq: Four times a day (QID) | ORAL | Status: DC | PRN
  Administered 2013-02-09: 25 mg via ORAL
  Filled 2013-02-06: qty 1

## 2013-02-06 MED ORDER — CEFAZOLIN SODIUM-DEXTROSE 2-3 GM-% IV SOLR
2.0000 g | Freq: Four times a day (QID) | INTRAVENOUS | Status: AC
  Administered 2013-02-06 – 2013-02-07 (×2): 2 g via INTRAVENOUS
  Filled 2013-02-06 (×2): qty 50

## 2013-02-06 MED ORDER — CEFAZOLIN SODIUM-DEXTROSE 2-3 GM-% IV SOLR
INTRAVENOUS | Status: AC
  Filled 2013-02-06: qty 50

## 2013-02-06 MED ORDER — KETAMINE HCL 10 MG/ML IJ SOLN
INTRAMUSCULAR | Status: AC
  Filled 2013-02-06: qty 1

## 2013-02-06 MED ORDER — BISACODYL 10 MG RE SUPP: 10.0000 mg | Freq: Every day | RECTAL | Status: DC | PRN

## 2013-02-06 MED ORDER — PROPOFOL INFUSION 10 MG/ML OPTIME
INTRAVENOUS | Status: DC | PRN
  Administered 2013-02-06: 75 ug/kg/min via INTRAVENOUS

## 2013-02-06 MED ORDER — SODIUM CHLORIDE 0.9 % IV SOLN
INTRAVENOUS | Status: DC
  Administered 2013-02-06 – 2013-02-07 (×2): via INTRAVENOUS
  Filled 2013-02-06 (×15): qty 1000

## 2013-02-06 MED ORDER — PROMETHAZINE HCL 25 MG/ML IJ SOLN: 6.2500 mg | INTRAMUSCULAR | Status: DC | PRN

## 2013-02-06 MED ORDER — BUPIVACAINE-EPINEPHRINE PF 0.25-1:200000 % IJ SOLN
INTRAMUSCULAR | Status: AC
  Filled 2013-02-06: qty 30

## 2013-02-06 MED ORDER — ONDANSETRON HCL 4 MG/2ML IJ SOLN
INTRAMUSCULAR | Status: AC
  Filled 2013-02-06: qty 2

## 2013-02-06 MED ORDER — FENTANYL CITRATE 0.05 MG/ML IJ SOLN
INTRAMUSCULAR | Status: AC
  Filled 2013-02-06: qty 2

## 2013-02-06 MED ORDER — INSULIN ASPART 100 UNIT/ML ~~LOC~~ SOLN
0.0000 [IU] | Freq: Three times a day (TID) | SUBCUTANEOUS | Status: DC
  Administered 2013-02-07 (×2): 3 [IU] via SUBCUTANEOUS
  Administered 2013-02-08: 2 [IU] via SUBCUTANEOUS
  Administered 2013-02-08: 3 [IU] via SUBCUTANEOUS
  Administered 2013-02-08: 2 [IU] via SUBCUTANEOUS

## 2013-02-06 MED ORDER — HYDROCODONE-ACETAMINOPHEN 7.5-325 MG PO TABS
1.0000 | ORAL_TABLET | ORAL | Status: DC
  Administered 2013-02-06: 1 via ORAL
  Administered 2013-02-06 – 2013-02-07 (×5): 2 via ORAL
  Administered 2013-02-07 (×2): 1 via ORAL
  Administered 2013-02-08 (×3): 2 via ORAL
  Administered 2013-02-08 – 2013-02-09 (×2): 1 via ORAL
  Administered 2013-02-09 (×2): 2 via ORAL
  Filled 2013-02-06 (×2): qty 2
  Filled 2013-02-06: qty 1
  Filled 2013-02-06 (×4): qty 2
  Filled 2013-02-06 (×4): qty 1
  Filled 2013-02-06: qty 2
  Filled 2013-02-06: qty 1
  Filled 2013-02-06 (×3): qty 2

## 2013-02-06 MED ORDER — BUPIVACAINE-EPINEPHRINE (PF) 0.25% -1:200000 IJ SOLN
INTRAMUSCULAR | Status: DC | PRN
  Administered 2013-02-06: 25 mL

## 2013-02-06 MED ORDER — PROPOFOL 10 MG/ML IV BOLUS
INTRAVENOUS | Status: AC
  Filled 2013-02-06: qty 20

## 2013-02-06 MED ORDER — METFORMIN HCL 500 MG PO TABS
1000.0000 mg | ORAL_TABLET | Freq: Two times a day (BID) | ORAL | Status: DC
  Administered 2013-02-06 – 2013-02-09 (×6): 1000 mg via ORAL
  Filled 2013-02-06 (×8): qty 2

## 2013-02-06 MED ORDER — SODIUM CHLORIDE 0.9 % IR SOLN
Status: DC | PRN
  Administered 2013-02-06: 1000 mL

## 2013-02-06 MED ORDER — 0.9 % SODIUM CHLORIDE (POUR BTL) OPTIME
TOPICAL | Status: DC | PRN
  Administered 2013-02-06: 1000 mL

## 2013-02-06 MED ORDER — PHENOL 1.4 % MT LIQD: 1.0000 | OROMUCOSAL | Status: DC | PRN

## 2013-02-06 MED ORDER — PHENYLEPHRINE 40 MCG/ML (10ML) SYRINGE FOR IV PUSH (FOR BLOOD PRESSURE SUPPORT)
PREFILLED_SYRINGE | INTRAVENOUS | Status: AC
  Filled 2013-02-06: qty 10

## 2013-02-06 MED ORDER — SODIUM CHLORIDE 0.9 % IJ SOLN
INTRAMUSCULAR | Status: AC
  Filled 2013-02-06: qty 20

## 2013-02-06 MED ORDER — DEXTROSE 5 % IV SOLN
500.0000 mg | Freq: Four times a day (QID) | INTRAVENOUS | Status: DC | PRN
  Filled 2013-02-06: qty 5

## 2013-02-06 MED ORDER — KETOROLAC TROMETHAMINE 30 MG/ML IJ SOLN
INTRAMUSCULAR | Status: DC | PRN
  Administered 2013-02-06: 30 mg

## 2013-02-06 MED ORDER — SODIUM CHLORIDE 0.9 % IJ SOLN
INTRAMUSCULAR | Status: AC
  Filled 2013-02-06: qty 10

## 2013-02-06 MED ORDER — PHENYLEPHRINE HCL 10 MG/ML IJ SOLN
INTRAMUSCULAR | Status: DC | PRN
  Administered 2013-02-06 (×3): 80 ug via INTRAVENOUS
  Administered 2013-02-06 (×2): 40 ug via INTRAVENOUS
  Administered 2013-02-06: 80 ug via INTRAVENOUS

## 2013-02-06 MED ORDER — HYDROMORPHONE HCL PF 1 MG/ML IJ SOLN: 0.2500 mg | INTRAMUSCULAR | Status: DC | PRN

## 2013-02-06 MED ORDER — ONDANSETRON HCL 4 MG PO TABS: 4.0000 mg | ORAL_TABLET | Freq: Four times a day (QID) | ORAL | Status: DC | PRN

## 2013-02-06 MED ORDER — METOCLOPRAMIDE HCL 5 MG/ML IJ SOLN
5.0000 mg | Freq: Three times a day (TID) | INTRAMUSCULAR | Status: DC | PRN
  Administered 2013-02-07: 5 mg via INTRAVENOUS
  Filled 2013-02-06: qty 2

## 2013-02-06 MED ORDER — ZOLPIDEM TARTRATE 5 MG PO TABS: 5.0000 mg | ORAL_TABLET | Freq: Every evening | ORAL | Status: DC | PRN

## 2013-02-06 MED ORDER — CHLORHEXIDINE GLUCONATE 4 % EX LIQD: 60.0000 mL | Freq: Once | CUTANEOUS | Status: DC

## 2013-02-06 MED ORDER — SIMVASTATIN 40 MG PO TABS
40.0000 mg | ORAL_TABLET | Freq: Every evening | ORAL | Status: DC
  Administered 2013-02-06 – 2013-02-08 (×3): 40 mg via ORAL
  Filled 2013-02-06 (×4): qty 1

## 2013-02-06 MED ORDER — MIDAZOLAM HCL 5 MG/5ML IJ SOLN
INTRAMUSCULAR | Status: DC | PRN
  Administered 2013-02-06 (×2): 1 mg via INTRAVENOUS

## 2013-02-06 MED ORDER — CEFAZOLIN SODIUM-DEXTROSE 2-3 GM-% IV SOLR
2.0000 g | INTRAVENOUS | Status: AC
  Administered 2013-02-06: 2 g via INTRAVENOUS

## 2013-02-06 MED ORDER — ASPIRIN EC 325 MG PO TBEC
325.0000 mg | DELAYED_RELEASE_TABLET | Freq: Two times a day (BID) | ORAL | Status: DC
  Administered 2013-02-07 – 2013-02-09 (×5): 325 mg via ORAL
  Filled 2013-02-06 (×8): qty 1

## 2013-02-06 MED ORDER — ONDANSETRON HCL 4 MG/2ML IJ SOLN
4.0000 mg | Freq: Four times a day (QID) | INTRAMUSCULAR | Status: DC | PRN
  Administered 2013-02-07: 4 mg via INTRAVENOUS
  Filled 2013-02-06: qty 2

## 2013-02-06 MED ORDER — LACTATED RINGERS IV SOLN
INTRAVENOUS | Status: DC
  Administered 2013-02-06: 12:00:00 via INTRAVENOUS
  Administered 2013-02-06: 1000 mL via INTRAVENOUS

## 2013-02-06 MED ORDER — KETOROLAC TROMETHAMINE 30 MG/ML IJ SOLN
INTRAMUSCULAR | Status: AC
  Filled 2013-02-06: qty 1

## 2013-02-06 MED ORDER — METHOCARBAMOL 500 MG PO TABS
500.0000 mg | ORAL_TABLET | Freq: Four times a day (QID) | ORAL | Status: DC | PRN
  Administered 2013-02-06 – 2013-02-09 (×4): 500 mg via ORAL
  Filled 2013-02-06 (×4): qty 1

## 2013-02-06 MED ORDER — SODIUM CHLORIDE 0.9 % IV SOLN
1000.0000 mg | Freq: Once | INTRAVENOUS | Status: AC
  Administered 2013-02-06: 1000 mg via INTRAVENOUS
  Filled 2013-02-06: qty 10

## 2013-02-06 MED ORDER — CELECOXIB 200 MG PO CAPS
200.0000 mg | ORAL_CAPSULE | Freq: Two times a day (BID) | ORAL | Status: DC
  Administered 2013-02-06 – 2013-02-09 (×6): 200 mg via ORAL
  Filled 2013-02-06 (×7): qty 1

## 2013-02-06 MED ORDER — MENTHOL 3 MG MT LOZG
1.0000 | LOZENGE | OROMUCOSAL | Status: DC | PRN
  Filled 2013-02-06: qty 9

## 2013-02-06 MED ORDER — METOCLOPRAMIDE HCL 10 MG PO TABS: 5.0000 mg | ORAL_TABLET | Freq: Three times a day (TID) | ORAL | Status: DC | PRN

## 2013-02-06 MED ORDER — SODIUM CHLORIDE 0.9 % IJ SOLN
INTRAMUSCULAR | Status: DC | PRN
  Administered 2013-02-06: 12:00:00

## 2013-02-06 MED ORDER — EPHEDRINE SULFATE 50 MG/ML IJ SOLN
INTRAMUSCULAR | Status: DC | PRN
  Administered 2013-02-06: 10 mg via INTRAVENOUS
  Administered 2013-02-06: 5 mg via INTRAVENOUS

## 2013-02-06 SURGICAL SUPPLY — 58 items
BAG ZIPLOCK 12X15 (MISCELLANEOUS) IMPLANT
BANDAGE ELASTIC 6 VELCRO ST LF (GAUZE/BANDAGES/DRESSINGS) ×3 IMPLANT
BANDAGE ESMARK 6X9 LF (GAUZE/BANDAGES/DRESSINGS) ×1 IMPLANT
BLADE SAW SGTL 13.0X1.19X90.0M (BLADE) ×3 IMPLANT
BNDG ESMARK 6X9 LF (GAUZE/BANDAGES/DRESSINGS) ×3
BONE CEMENT GENTAMICIN (Cement) ×6 IMPLANT
BOWL SMART MIX CTS (DISPOSABLE) ×3 IMPLANT
CAPT RP KNEE ×3 IMPLANT
CEMENT BONE GENTAMICIN 40 (Cement) ×2 IMPLANT
CUFF TOURN SGL QUICK 34 (TOURNIQUET CUFF) ×2
CUFF TRNQT CYL 34X4X40X1 (TOURNIQUET CUFF) ×1 IMPLANT
DERMABOND ADVANCED (GAUZE/BANDAGES/DRESSINGS) ×2
DERMABOND ADVANCED .7 DNX12 (GAUZE/BANDAGES/DRESSINGS) ×1 IMPLANT
DRAPE EXTREMITY T 121X128X90 (DRAPE) ×3 IMPLANT
DRAPE POUCH INSTRU U-SHP 10X18 (DRAPES) ×3 IMPLANT
DRAPE U-SHAPE 47X51 STRL (DRAPES) ×3 IMPLANT
DRSG AQUACEL AG ADV 3.5X10 (GAUZE/BANDAGES/DRESSINGS) ×3 IMPLANT
DRSG TEGADERM 4X4.75 (GAUZE/BANDAGES/DRESSINGS) IMPLANT
DURAPREP 26ML APPLICATOR (WOUND CARE) ×6 IMPLANT
ELECT REM PT RETURN 9FT ADLT (ELECTROSURGICAL) ×3
ELECTRODE REM PT RTRN 9FT ADLT (ELECTROSURGICAL) ×1 IMPLANT
EVACUATOR 1/8 PVC DRAIN (DRAIN) IMPLANT
FACESHIELD LNG OPTICON STERILE (SAFETY) ×12 IMPLANT
GAUZE SPONGE 2X2 8PLY STRL LF (GAUZE/BANDAGES/DRESSINGS) IMPLANT
GLOVE BIOGEL PI IND STRL 7.5 (GLOVE) ×1 IMPLANT
GLOVE BIOGEL PI IND STRL 8 (GLOVE) ×1 IMPLANT
GLOVE BIOGEL PI INDICATOR 7.5 (GLOVE) ×2
GLOVE BIOGEL PI INDICATOR 8 (GLOVE) ×2
GLOVE ECLIPSE 8.0 STRL XLNG CF (GLOVE) ×3 IMPLANT
GLOVE ORTHO TXT STRL SZ7.5 (GLOVE) ×6 IMPLANT
GOWN SPEC L3 XXLG W/TWL (GOWN DISPOSABLE) ×3 IMPLANT
GOWN STRL REUS W/TWL LRG LVL3 (GOWN DISPOSABLE) ×9 IMPLANT
HANDPIECE INTERPULSE COAX TIP (DISPOSABLE) ×2
KIT BASIN OR (CUSTOM PROCEDURE TRAY) ×3 IMPLANT
MANIFOLD NEPTUNE II (INSTRUMENTS) ×3 IMPLANT
NDL SAFETY ECLIPSE 18X1.5 (NEEDLE) ×2 IMPLANT
NEEDLE HYPO 18GX1.5 SHARP (NEEDLE) ×4
NEEDLE HYPO 21X1.5 SAFETY (NEEDLE) ×3 IMPLANT
NS IRRIG 1000ML POUR BTL (IV SOLUTION) ×3 IMPLANT
PACK TOTAL JOINT (CUSTOM PROCEDURE TRAY) ×3 IMPLANT
POSITIONER SURGICAL ARM (MISCELLANEOUS) ×3 IMPLANT
SET HNDPC FAN SPRY TIP SCT (DISPOSABLE) ×1 IMPLANT
SET PAD KNEE POSITIONER (MISCELLANEOUS) ×3 IMPLANT
SPONGE GAUZE 2X2 STER 10/PKG (GAUZE/BANDAGES/DRESSINGS)
SUCTION FRAZIER 12FR DISP (SUCTIONS) ×3 IMPLANT
SUT MNCRL AB 4-0 PS2 18 (SUTURE) ×3 IMPLANT
SUT VIC AB 1 CT1 36 (SUTURE) ×3 IMPLANT
SUT VIC AB 2-0 CT1 27 (SUTURE) ×6
SUT VIC AB 2-0 CT1 TAPERPNT 27 (SUTURE) ×3 IMPLANT
SUT VLOC 180 0 24IN GS25 (SUTURE) ×3 IMPLANT
SYR 20CC LL (SYRINGE) ×3 IMPLANT
SYR 3ML LL SCALE MARK (SYRINGE) ×3 IMPLANT
SYR 50ML LL SCALE MARK (SYRINGE) ×3 IMPLANT
TOWEL OR 17X26 10 PK STRL BLUE (TOWEL DISPOSABLE) ×3 IMPLANT
TOWEL OR NON WOVEN STRL DISP B (DISPOSABLE) ×3 IMPLANT
TRAY FOLEY CATH 14FRSI W/METER (CATHETERS) ×3 IMPLANT
WATER STERILE IRR 1500ML POUR (IV SOLUTION) ×3 IMPLANT
WRAP KNEE MAXI GEL POST OP (GAUZE/BANDAGES/DRESSINGS) ×3 IMPLANT

## 2013-02-06 NOTE — Progress Notes (Signed)
Clinical Social Work Department BRIEF PSYCHOSOCIAL ASSESSMENT 02/06/2013  Patient:  Traci Reed, Traci Reed     Account Number:  [id number]     Admit date:  02/06/2013  Clinical Social Worker:  Lacie Scotts  Date/Time:  02/06/2013 04:55 PM  Referred by:  Physician  Date Referred:  02/06/2013 Referred for  SNF Placement   Other Referral:   Interview type:  Patient Other interview type:    PSYCHOSOCIAL DATA Living Status:  HUSBAND Admitted from facility:   Level of care:   Primary support name:  Cammy Brochure Primary support relationship to patient:  SPOUSE Degree of support available:   unclear    CURRENT CONCERNS Current Concerns  Post-Acute Placement   Other Concerns:    SOCIAL WORK ASSESSMENT / PLAN Pt is a 68 yr old female living at home prior to hospitalization. CSW met with pt to assist with d/c planning. Pt has made prior arrangements to have ST Rehab at [facility] following hospital d/c. CSW has contacted SNF and d/c plan has been confirmed. CSW will continue to follow to assist with d/c planning needs.   Assessment/plan status:  Psychosocial Support/Ongoing Assessment of Needs Other assessment/ plan:   Information/referral to community resources:   CSW will see pt in am to review Insurance coverage for SNF / ambulance transport.    PATIENT'S/FAMILY'S RESPONSE TO PLAN OF CARE: Pt confirmed she will be having rehab at [facility].    Werner Lean LCSW [phone]

## 2013-02-06 NOTE — Progress Notes (Signed)
Utilization review completed.  

## 2013-02-06 NOTE — Anesthesia Postprocedure Evaluation (Signed)
  Anesthesia Post-op Note  Patient: Traci Reed  Procedure(s) Performed: Procedure(s) (LRB): LEFT TOTAL KNEE ARTHROPLASTY (Left)  Patient Location: PACU  Anesthesia Type: Spinal  Level of Consciousness: awake and alert   Airway and Oxygen Therapy: Patient Spontanous Breathing  Post-op Pain: mild  Post-op Assessment: Post-op Vital signs reviewed, Patient's Cardiovascular Status Stable, Respiratory Function Stable, Patent Airway and No signs of Nausea or vomiting  Last Vitals:  Filed Vitals:   02/06/13 1249  BP:   Pulse:   Temp: 36.9 C  Resp:     Post-op Vital Signs: stable   Complications: No apparent anesthesia complications

## 2013-02-06 NOTE — Progress Notes (Signed)
Clinical Social Work Department CLINICAL SOCIAL WORK PLACEMENT NOTE 02/06/2013  Patient:  Traci Reed,Traci Reed  Account Number:  [id number] Admit date:  02/06/2013  Clinical Social Worker:  Cori RazorJAMIE Kaitlynn Tramontana, LCSW  Date/time:  02/06/2013 05:04 PM  Clinical Social Work is seeking post-discharge placement for this patient at the following level of care:   SKILLED NURSING   (*CSW will update this form in Epic as items are completed)     Patient/family provided with [facility] Department of Clinical Social Work's list of facilities offering this level of care within the geographic area requested by the patient (or if unable, by the patient's family).  02/06/2013  Patient/family informed of their freedom to choose among providers that offer the needed level of care, that participate in Medicare, Medicaid or managed care program needed by the patient, have an available bed and are willing to accept the patient.    Patient/family informed of [facility]' ownership interest in [facility], as well as of the fact that they are under no obligation to receive care at this facility.  PASARR submitted to EDS on 02/06/2013 PASARR number received from EDS on 02/06/2013  FL2 transmitted to all facilities in geographic area requested by pt/family on  02/06/2013 FL2 transmitted to all facilities within larger geographic area on   Patient informed that his/her managed care company has contracts with or will negotiate with  certain facilities, including the following:     Patient/family informed of bed offers received:  02/06/2013 [facility] Physician recommends and patient chooses bed at    [facility]   Patient to be transferred to facility by   The following physician request were entered in Epic:   Additional Comments:  Cori RazorJamie Natalio Salois LCSW [phone]

## 2013-02-06 NOTE — Op Note (Signed)
NAME:  Traci Reed                      MEDICAL RECORD NO.:  [id number]                             FACILITY:  [facility]      PHYSICIAN:  Madlyn Frankel. Charlann Boxer, M.D.  DATE OF BIRTH:  February 29, 1945      DATE OF PROCEDURE:  02/06/2013                                     OPERATIVE REPORT         PREOPERATIVE DIAGNOSIS:  Left knee osteoarthritis.      POSTOPERATIVE DIAGNOSIS:  Left knee osteoarthritis.      FINDINGS:  The patient was noted to have complete loss of cartilage and   bone-on-bone arthritis with associated osteophytes in the medial and patellofemoral compartments of   the knee with a significant synovitis and associated effusion.      PROCEDURE:  Left total knee replacement.      COMPONENTS USED:  DePuy rotating platform posterior stabilized knee   system, a size 3 femur, 3 tibia, 12.5 mm PS insert, and 38 patellar   button.      SURGEON:  Madlyn Frankel. Charlann Boxer, M.D.      ASSISTANT:  Lanney Gins, PA-C.      ANESTHESIA:  Spinal.      SPECIMENS:  None.      COMPLICATION:  None.      DRAINS:  One Hemovac.  EBL: <100      TOURNIQUET TIME:   Total Tourniquet Time Documented: Thigh (Left) - 39 minutes Total: Thigh (Left) - 39 minutes  .      The patient was stable to the recovery room.      INDICATION FOR PROCEDURE:  Traci Reed is a 68 y.o. female patient of   mine.  The patient had been seen, evaluated, and treated conservatively in the   office with medication, activity modification, and injections.  The patient had   radiographic changes of bone-on-bone arthritis with endplate sclerosis and osteophytes noted.      The patient failed conservative measures including medication, injections, and activity modification, and at this point was ready for more definitive measures.   Based on the radiographic changes and failed conservative measures, the patient   decided to proceed with total knee replacement.  Risks of infection,   DVT, component failure, need for revision  surgery, postop course, and   expectations were all   discussed and reviewed.  Consent was obtained for benefit of pain   relief.      PROCEDURE IN DETAIL:  The patient was brought to the operative theater.   Once adequate anesthesia, preoperative antibiotics, 2 gm of Ancef administered, the patient was positioned supine with the left thigh tourniquet placed.  The  left lower extremity was prepped and draped in sterile fashion.  A time-   out was performed identifying the patient, planned procedure, and   extremity.      The left lower extremity was placed in the [facility] leg holder.  The leg was   exsanguinated, tourniquet elevated to 250 mmHg.  A midline incision was   made followed by median parapatellar arthrotomy.  Following initial   exposure, attention was  first directed to the patella.  Precut   measurement was noted to be 25 mm.  I resected down to 14 mm and used a   38 patellar button to restore patellar height as well as cover the cut   surface.      The lug holes were drilled and a metal shim was placed to protect the   patella from retractors and saw blades.      At this point, attention was now directed to the femur.  The femoral   canal was opened with a drill, irrigated to try to prevent fat emboli.  An   intramedullary rod was passed at 5 degrees valgus, 10 mm of bone was   resected off the distal femur.  Following this resection, the tibia was   subluxated anteriorly.  Using the extramedullary guide, 10 mm of bone was resected off   the proximal lateral tibia.  We confirmed the gap would be   stable medially and laterally with a 10 mm insert as well as confirmed   the cut was perpendicular in the coronal plane, checking with an alignment rod.      Once this was done, I sized the femur to be a size 3 in the anterior-   posterior dimension, chose a standard component based on medial and   lateral dimension.  The size 3 rotation block was then pinned in   position  anterior referenced using the C-clamp to set rotation.  The   anterior, posterior, and  chamfer cuts were made without difficulty nor   notching making certain that I was along the anterior cortex to help   with flexion gap stability.      The final box cut was made off the lateral aspect of distal femur.      At this point, the tibia was sized to be a size 3, the size 3 tray was   then pinned in position through the medial third of the tubercle,   drilled, and keel punched.  Trial reduction was now carried with a 3 femur,  3 tibia, a 12.5 mm insert, and the 38 patella botton.  The knee was brought to   extension, full extension with good flexion stability with the patella   tracking through the trochlea without application of pressure.  Given   all these findings, the trial components removed.  Final components were   opened and cement was mixed.  The knee was irrigated with normal saline   solution and pulse lavage.  The synovial lining was   then injected with 20cc of Exparel, 30cc of 0.25% Marcaine with epinephrine and 1 cc of Toradol,   total of 61 cc.      The knee was irrigated.  Final implants were then cemented onto clean and   dried cut surfaces of bone with the knee brought to extension with a 12.5 mm trial insert.      Once the cement had fully cured, the excess cement was removed   throughout the knee.  I confirmed I was satisfied with the range of   motion and stability, and the final 12.5 mm PS insert was chosen.  It was   placed into the knee.      The tourniquet had been let down at 40 minutes.  No significant   hemostasis required.  The medium Hemovac drain was placed deep.  The   extensor mechanism was then reapproximated using #1 Vicryl with the knee  in flexion.  The   remaining wound was closed with 2-0 Vicryl and running 4-0 Monocryl.   The knee was cleaned, dried, dressed sterilely using Dermabond and   Aquacel dressing.  Drain site dressed separately.  The  patient was then   brought to recovery room in stable condition, tolerating the procedure   well.   Please note that Physician Assistant, Lanney Gins, PA-C, was present for the entirety of the case, and was utilized for pre-operative positioning, peri-operative retractor management, general facilitation of the procedure.  He was also utilized for primary wound closure at the end of the case.              Madlyn Frankel Charlann Boxer, M.D.    02/06/2013 12:26 PM

## 2013-02-06 NOTE — Anesthesia Preprocedure Evaluation (Signed)
Anesthesia Evaluation  Patient identified by MRN, date of birth, ID band Patient awake    Reviewed: Allergy & Precautions, H&P , NPO status , Patient's Chart, lab work & pertinent test results  Airway Mallampati: III TM Distance: <3 FB Neck ROM: Full    Dental no notable dental hx.    Pulmonary neg pulmonary ROS, former smoker,  breath sounds clear to auscultation  Pulmonary exam normal       Cardiovascular hypertension, Pt. on medications Rhythm:Regular Rate:Normal     Neuro/Psych negative neurological ROS  negative psych ROS   GI/Hepatic negative GI ROS, Neg liver ROS,   Endo/Other  diabetes, Oral Hypoglycemic AgentsMorbid obesity  Renal/GU negative Renal ROS  negative genitourinary   Musculoskeletal negative musculoskeletal ROS (+)   Abdominal   Peds negative pediatric ROS (+)  Hematology negative hematology ROS (+)   Anesthesia Other Findings   Reproductive/Obstetrics negative OB ROS                           Anesthesia Physical Anesthesia Plan  ASA: III  Anesthesia Plan: Spinal   Post-op Pain Management:    Induction: Intravenous  Airway Management Planned: Simple Face Mask  Additional Equipment:   Intra-op Plan:   Post-operative Plan:   Informed Consent: I have reviewed the patients History and Physical, chart, labs and discussed the procedure including the risks, benefits and alternatives for the proposed anesthesia with the patient or authorized representative who has indicated his/her understanding and acceptance.   Dental advisory given  Plan Discussed with: CRNA and Surgeon  Anesthesia Plan Comments:         Anesthesia Quick Evaluation

## 2013-02-06 NOTE — Anesthesia Procedure Notes (Addendum)
Spinal  Patient location during procedure: OR Start time: 02/06/2013 10:53 AM End time: 02/06/2013 10:58 AM Staffing CRNA/Resident: Alfonso Patten J Performed by: resident/CRNA  Preanesthetic Checklist Completed: patient identified, surgical consent, pre-op evaluation, IV checked, risks and benefits discussed and monitors and equipment checked Spinal Block Patient position: sitting Prep: Betadine Patient monitoring: heart rate, continuous pulse ox and blood pressure Approach: midline Location: L2-3 Injection technique: single-shot Needle Needle type: Sprotte  Needle gauge: 24 G Needle length: 10 cm Additional Notes No c/o pain or paresthesia.  One attempt.  Patient tolerated well. Lot number and expiration noted on kit.

## 2013-02-06 NOTE — Evaluation (Signed)
Physical Therapy Evaluation Patient Details Name: Traci Reed MRN: [id number] DOB: Oct 14, 1945 Today's Date: 02/06/2013 Time: 4098-11911625-1701 PT Time Calculation (min): 36 min  PT Assessment / Plan / Recommendation History of Present Illness  LTKA  Clinical Impression  Pt mobilized to edge of bed and became dizzy with drop in BP, diaphoretic. Pt returned to supine. Pt plans SNF. Pt will benefit from PT to address problems.   PT Assessment  Patient needs continued PT services    Follow Up Recommendations  SNF    Does the patient have the potential to tolerate intense rehabilitation      Barriers to Discharge        Equipment Recommendations  Rolling walker with 5" wheels    Recommendations for Other Services     Frequency 7X/week    Precautions / Restrictions Precautions Precautions: Fall Precaution Comments: hypotensive   Pertinent Vitals/Pain Pre-supine 91/60/ HR 60 Sitting 89/59, hr54 Return to supine 86/56, HR 56 RN aware.      Mobility  Bed Mobility Overal bed mobility: Needs Assistance;+2 for physical assistance;+ 2 for safety/equipment Bed Mobility: Supine to Sit;Sit to Supine Supine to sit: +2 for physical assistance;+2 for safety/equipment;Min assist Sit to supine: +2 for safety/equipment;+2 for physical assistance;Total assist General bed mobility comments: pt able to get to sitting with rail and HOB elevated, Became dizzy and weak , near syncope and assisted back to supine. Transfers General transfer comment: Did not stand due to dizziness/decr. BP    Exercises Total Joint Exercises Quad Sets: AROM;Left;5 reps;Supine Heel Slides: AAROM;Left;5 reps Straight Leg Raises: AAROM;Left;5 reps   PT Diagnosis: Difficulty walking;Acute pain  PT Problem List: Decreased strength;Decreased activity tolerance;Decreased range of motion;Decreased mobility;Decreased safety awareness;Decreased knowledge of use of DME;Cardiopulmonary status limiting activity;Pain PT  Treatment Interventions: DME instruction;Gait training;Functional mobility training;Therapeutic activities;Therapeutic exercise;Patient/family education     PT Goals(Current goals can be found in the care plan section) Acute Rehab PT Goals Patient Stated Goal: i want to walk PT Goal Formulation: With patient/family Time For Goal Achievement: 02/13/13 Potential to Achieve Goals: Good  Visit Information  Last PT Received On: 02/06/13 Assistance Needed: +2 History of Present Illness: LTKA       Prior Functioning  Home Living Family/patient expects to be discharged to:: Skilled nursing facility Living Arrangements: Spouse/significant other Prior Function Level of Independence: Independent Communication Communication: No difficulties    Cognition  Cognition Arousal/Alertness: Awake/alert Behavior During Therapy: WFL for tasks assessed/performed Overall Cognitive Status: Within Functional Limits for tasks assessed    Extremity/Trunk Assessment Upper Extremity Assessment Upper Extremity Assessment: Overall WFL for tasks assessed Lower Extremity Assessment Lower Extremity Assessment: LLE deficits/detail RLE Deficits / Details: wfl LLE Deficits / Details: knee flexion=40*, SLR with min assist.   Balance    End of Session PT - End of Session Equipment Utilized During Treatment: Gait belt Activity Tolerance: Treatment limited secondary to medical complications (Comment) Patient left: in bed;with call bell/phone within reach;with family/visitor present Nurse Communication: Mobility status (drop in BP)  GP     Rada HayHill, Liston Thum Elizabeth 02/06/2013, 6:27 PM Blanchard KelchKaren Lonie Newsham PT [phone]

## 2013-02-06 NOTE — Transfer of Care (Signed)
Immediate Anesthesia Transfer of Care Note  Patient: Traci Reed  Procedure(s) Performed: Procedure(s): LEFT TOTAL KNEE ARTHROPLASTY (Left)  Patient Location: PACU  Anesthesia Type:Spinal  Level of Consciousness: awake, alert , oriented and patient cooperative  Airway & Oxygen Therapy: Patient Spontanous Breathing and Patient connected to face mask oxygen  Post-op Assessment: Report given to PACU RN, Post -op Vital signs reviewed and stable and SAB level T12.  Post vital signs: Reviewed and stable  Complications: No apparent anesthesia complications

## 2013-02-06 NOTE — Interval H&P Note (Signed)
History and Physical Interval Note:  02/06/2013 9:25 AM  Traci Reed  has presented today for surgery, with the diagnosis of LEFT KNEE OA  The various methods of treatment have been discussed with the patient and family. After consideration of risks, benefits and other options for treatment, the patient has consented to  Procedure(s): LEFT TOTAL KNEE ARTHROPLASTY (Left) as a surgical intervention .  The patient's history has been reviewed, patient examined, no change in status, stable for surgery.  I have reviewed the patient's chart and labs.  Questions were answered to the patient's satisfaction.     Shelda PalLIN,Jamira Barfuss D

## 2013-02-07 DIAGNOSIS — D62 Acute posthemorrhagic anemia: Secondary | ICD-10-CM | POA: Diagnosis not present

## 2013-02-07 LAB — CBC
HCT: 28.4 % — ABNORMAL LOW (ref 36.0–46.0)
HEMOGLOBIN: 9.3 g/dL — AB (ref 12.0–15.0)
MCH: 27.5 pg (ref 26.0–34.0)
MCHC: 32.7 g/dL (ref 30.0–36.0)
MCV: 84 fL (ref 78.0–100.0)
Platelets: 141 10*3/uL — ABNORMAL LOW (ref 150–400)
RBC: 3.38 MIL/uL — AB (ref 3.87–5.11)
RDW: 13.3 % (ref 11.5–15.5)
WBC: 6 10*3/uL (ref 4.0–10.5)

## 2013-02-07 LAB — BASIC METABOLIC PANEL
BUN: 11 mg/dL (ref 6–23)
CHLORIDE: 102 meq/L (ref 96–112)
CO2: 23 meq/L (ref 19–32)
Calcium: 8.4 mg/dL (ref 8.4–10.5)
Creatinine, Ser: 0.65 mg/dL (ref 0.50–1.10)
GFR calc Af Amer: >90 mL/min (ref >90)
GFR calc non Af Amer: 89 mL/min — ABNORMAL LOW (ref >90)
GLUCOSE: 142 mg/dL — AB (ref 70–99)
POTASSIUM: 4.9 meq/L (ref 3.7–5.3)
SODIUM: 136 meq/L — AB (ref 137–147)

## 2013-02-07 LAB — GLUCOSE, CAPILLARY
GLUCOSE-CAPILLARY: 125 mg/dL — AB (ref 70–99)
GLUCOSE-CAPILLARY: 179 mg/dL — AB (ref 70–99)
Glucose-Capillary: 181 mg/dL — ABNORMAL HIGH (ref 70–99)
Glucose-Capillary: 167 mg/dL — ABNORMAL HIGH (ref 70–99)

## 2013-02-07 MED ORDER — PROMETHAZINE HCL 25 MG PO TABS: 12.5000 mg | ORAL_TABLET | Freq: Four times a day (QID) | ORAL | Status: DC | PRN

## 2013-02-07 MED ORDER — PROMETHAZINE HCL 25 MG/ML IJ SOLN: 6.2500 mg | Freq: Four times a day (QID) | INTRAMUSCULAR | Status: DC | PRN

## 2013-02-07 NOTE — Progress Notes (Signed)
Physical Therapy Treatment Patient Details Name: Traci Reed MRN: [id number] DOB: 06-27-1945 Today's Date: 02/07/2013 Time: [id number] PT Time Calculation (min): 24 min  PT Assessment / Plan / Recommendation  History of Present Illness LTKA   PT Comments     Follow Up Recommendations  SNF     Does the patient have the potential to tolerate intense rehabilitation     Barriers to Discharge        Equipment Recommendations  Rolling walker with 5" wheels    Recommendations for Other Services OT consult  Frequency 7X/week   Progress towards PT Goals Progress towards PT goals: Progressing toward goals  Plan Current plan remains appropriate    Precautions / Restrictions Precautions Precautions: Fall Restrictions Weight Bearing Restrictions: No Other Position/Activity Restrictions: WBAT   Pertinent Vitals/Pain 5/10; premed, RN aware, ice packs provided.    Mobility  Bed Mobility Overal bed mobility: Needs Assistance Bed Mobility: Supine to Sit;Sit to Supine Supine to sit: Mod assist Sit to supine: Mod assist General bed mobility comments: Cues for sequence and use of R LE to self assist Transfers Overall transfer level: Needs assistance Equipment used: Rolling walker (2 wheeled) Transfers: Sit to/from Stand Sit to Stand: Mod assist General transfer comment: cues for LE management and use of UEs to self assist Ambulation/Gait Ambulation/Gait assistance: Min assist;Mod assist Ambulation Distance (Feet): 48 Feet (twice) Assistive device: Rolling walker (2 wheeled) Gait Pattern/deviations: Step-to pattern;Decreased step length - right;Decreased step length - left;Shuffle;Antalgic;Trunk flexed General Gait Details: Cues for sequence, posture and position from RW    Exercises     PT Diagnosis:    PT Problem List:   PT Treatment Interventions:     PT Goals (current goals can now be found in the care plan section) Acute Rehab PT Goals Patient Stated Goal: i want to  walk PT Goal Formulation: With patient/family Time For Goal Achievement: 02/13/13 Potential to Achieve Goals: Good  Visit Information  Last PT Received On: 02/07/13 Assistance Needed: +1 History of Present Illness: LTKA    Subjective Data  Subjective: I didnt get dizzy Patient Stated Goal: i want to walk   Cognition  Cognition Arousal/Alertness: Awake/alert Behavior During Therapy: WFL for tasks assessed/performed Overall Cognitive Status: Within Functional Limits for tasks assessed    Balance     End of Session PT - End of Session Equipment Utilized During Treatment: Gait belt Activity Tolerance: Patient tolerated treatment well Patient left: in bed;with call bell/phone within reach;with nursing/sitter in room Nurse Communication: Mobility status   GP     Traci Reed 02/07/2013, 3:44 PM

## 2013-02-07 NOTE — Progress Notes (Signed)
OT Cancellation Note  Patient Details Name: Traci Reed MRN: [id number] DOB: Apr 11, 1945   Cancelled Treatment:    Reason Eval/Treat Not Completed: Other (comment)  Will defer ot eval to snf.   Rockey Guarino 02/07/2013, 7:08 AM Marica OtterMaryellen Brynlea Spindler, OTR/L [phone] 02/07/2013

## 2013-02-07 NOTE — Progress Notes (Signed)
Physical Therapy Treatment Patient Details Name: Traci Reed MRN: [id number] DOB: 04/29/1945 Today's Date: 02/07/2013 Time: [id number] PT Time Calculation (min): 36 min  PT Assessment / Plan / Recommendation  History of Present Illness LTKA   PT Comments   C/o transient dizziness with move supine to sit.   Follow Up Recommendations  SNF     Does the patient have the potential to tolerate intense rehabilitation     Barriers to Discharge        Equipment Recommendations  Rolling walker with 5" wheels    Recommendations for Other Services OT consult  Frequency 7X/week   Progress towards PT Goals Progress towards PT goals: Progressing toward goals  Plan Current plan remains appropriate    Precautions / Restrictions Precautions Precautions: Fall Restrictions Weight Bearing Restrictions: No Other Position/Activity Restrictions: WBAT   Pertinent Vitals/Pain 6/10 with amb; premed, cold packs provided    Mobility  Bed Mobility Overal bed mobility: Needs Assistance;+2 for physical assistance;+ 2 for safety/equipment Bed Mobility: Supine to Sit Supine to sit: Mod assist General bed mobility comments: Cues for sequence and use of R LE to self assist Transfers Overall transfer level: Needs assistance Equipment used: Rolling walker (2 wheeled) Transfers: Sit to/from Stand Sit to Stand: Mod assist General transfer comment: cues for LE management and use of UEs to self assist Ambulation/Gait Ambulation/Gait assistance: +2 safety/equipment;Min assist;Mod assist Ambulation Distance (Feet): 20 Feet Assistive device: Rolling walker (2 wheeled) Gait Pattern/deviations: Step-to pattern;Decreased step length - right;Decreased step length - left;Shuffle;Antalgic General Gait Details: Cues for sequence, posture and position from RW    Exercises Total Joint Exercises Ankle Circles/Pumps: AROM;15 reps;Both;Supine Quad Sets: AROM;Both;10 reps;Supine Heel Slides: AAROM;Left;10  reps;Supine Straight Leg Raises: AAROM;AROM;Left;10 reps;Supine   PT Diagnosis:    PT Problem List:   PT Treatment Interventions:     PT Goals (current goals can now be found in the care plan section) Acute Rehab PT Goals Patient Stated Goal: i want to walk PT Goal Formulation: With patient/family Time For Goal Achievement: 02/13/13 Potential to Achieve Goals: Good  Visit Information  Last PT Received On: 02/07/13 Assistance Needed: +2 History of Present Illness: LTKA    Subjective Data  Patient Stated Goal: i want to walk   Cognition  Cognition Arousal/Alertness: Awake/alert Behavior During Therapy: WFL for tasks assessed/performed Overall Cognitive Status: Within Functional Limits for tasks assessed    Balance     End of Session PT - End of Session Equipment Utilized During Treatment: Gait belt Activity Tolerance: Patient tolerated treatment well Patient left: in chair;with call bell/phone within reach Nurse Communication: Mobility status   GP     Hendrik Donath 02/07/2013, 12:40 PM

## 2013-02-07 NOTE — Progress Notes (Signed)
   Subjective: 1 Day Post-Op Procedure(s) (LRB): LEFT TOTAL KNEE ARTHROPLASTY (Left)   Patient reports pain as mild, pain controlled. Hypotensive issues yesterday and some nausea this morning.   Objective:   VITALS:   Filed Vitals:   02/07/13 0606  BP: 120/76  Pulse: 76  Temp: 98.4 F (36.9 C)  Resp: 16    Neurovascular intact Dorsiflexion/Plantar flexion intact Incision: dressing C/D/I No cellulitis present Compartment soft  LABS  Recent Labs  02/07/13 0435  HGB 9.3*  HCT 28.4*  WBC 6.0  PLT 141*     Recent Labs  02/07/13 0435  NA 136*  K 4.9  BUN 11  CREATININE 0.65  GLUCOSE 142*     Assessment/Plan: 1 Day Post-Op Procedure(s) (LRB): LEFT TOTAL KNEE ARTHROPLASTY (Left) Advance diet Up with therapy D/C IV fluids Discharge to SNF eventually, when ready  ExpectedABLA  Treated with iron and will observe  Morbid Obesity (BMI >40)  Estimated body mass index is 41.86 kg/(m^2) as calculated from the following:   Height as of this encounter: 5\' 4"  (1.626 m).   Weight as of this encounter: 110.678 kg (244 lb). Patient also counseled that weight may inhibit the healing process Patient counseled that losing weight will help with future health issues       Anastasio AuerbachMatthew S. Cyntha Brickman   PAC  02/07/2013, 9:18 AM

## 2013-02-08 LAB — BASIC METABOLIC PANEL
BUN: 8 mg/dL (ref 6–23)
CO2: 27 mEq/L (ref 19–32)
CREATININE: 0.62 mg/dL (ref 0.50–1.10)
Calcium: 8.7 mg/dL (ref 8.4–10.5)
Chloride: 102 mEq/L (ref 96–112)
GFR calc non Af Amer: >90 mL/min (ref >90)
Glucose, Bld: 158 mg/dL — ABNORMAL HIGH (ref 70–99)
Potassium: 4.4 mEq/L (ref 3.7–5.3)
Sodium: 137 mEq/L (ref 137–147)

## 2013-02-08 LAB — CBC
HCT: 27.9 % — ABNORMAL LOW (ref 36.0–46.0)
Hemoglobin: 9.1 g/dL — ABNORMAL LOW (ref 12.0–15.0)
MCH: 27.7 pg (ref 26.0–34.0)
MCHC: 32.6 g/dL (ref 30.0–36.0)
MCV: 84.8 fL (ref 78.0–100.0)
PLATELETS: 130 10*3/uL — AB (ref 150–400)
RBC: 3.29 MIL/uL — ABNORMAL LOW (ref 3.87–5.11)
RDW: 13.1 % (ref 11.5–15.5)
WBC: 5.9 10*3/uL (ref 4.0–10.5)

## 2013-02-08 LAB — GLUCOSE, CAPILLARY
GLUCOSE-CAPILLARY: 155 mg/dL — AB (ref 70–99)
GLUCOSE-CAPILLARY: 138 mg/dL — AB (ref 70–99)
Glucose-Capillary: 146 mg/dL — ABNORMAL HIGH (ref 70–99)
Glucose-Capillary: 136 mg/dL — ABNORMAL HIGH (ref 70–99)

## 2013-02-08 MED ORDER — POLYETHYLENE GLYCOL 3350 17 G PO PACK: 17.0000 g | PACK | Freq: Two times a day (BID) | ORAL | Status: DC

## 2013-02-08 MED ORDER — FERROUS SULFATE 325 (65 FE) MG PO TABS: 325.0000 mg | ORAL_TABLET | Freq: Three times a day (TID) | ORAL | Status: DC

## 2013-02-08 MED ORDER — HYDROCODONE-ACETAMINOPHEN 7.5-325 MG PO TABS: 1.0000 | ORAL_TABLET | ORAL | Status: AC | PRN

## 2013-02-08 MED ORDER — TIZANIDINE HCL 4 MG PO CAPS: 4.0000 mg | ORAL_CAPSULE | Freq: Three times a day (TID) | ORAL | Status: AC | PRN

## 2013-02-08 MED ORDER — ASPIRIN 325 MG PO TBEC
325.0000 mg | DELAYED_RELEASE_TABLET | Freq: Two times a day (BID) | ORAL | Status: AC
Start: 2013-02-08 — End: 2013-03-07

## 2013-02-08 MED ORDER — DSS 100 MG PO CAPS: 100.0000 mg | ORAL_CAPSULE | Freq: Two times a day (BID) | ORAL | Status: DC

## 2013-02-08 NOTE — Progress Notes (Signed)
   Subjective: 2 Days Post-Op Procedure(s) (LRB): LEFT TOTAL KNEE ARTHROPLASTY (Left)   Patient reports pain as mild, pain controlled. States that the pain is better after this TKA, then after the right knee. No events throughout the night. She states that the hypotension is gone and the nausea has resolved.   Objective:   VITALS:   Filed Vitals:   02/08/13 0831  BP: 108/71  Pulse: 80  Temp: 98.6 F (37 C)  Resp: 17    Neurovascular intact Dorsiflexion/Plantar flexion intact Incision: dressing C/D/I No cellulitis present Compartment soft  LABS  Recent Labs  02/07/13 0435 02/08/13 0445  HGB 9.3* 9.1*  HCT 28.4* 27.9*  WBC 6.0 5.9  PLT 141* 130*     Recent Labs  02/07/13 0435 02/08/13 0445  NA 136* 137  K 4.9 4.4  BUN 11 8  CREATININE 0.65 0.62  GLUCOSE 142* 158*     Assessment/Plan: 2 Days Post-Op Procedure(s) (LRB): LEFT TOTAL KNEE ARTHROPLASTY (Left) Up with therapy D/C IV fluids Discharge to SNF eventually, when ready  ExpectedABLA  Treated with iron and will observe   Morbid Obesity (BMI >40)  Estimated body mass index is 41.86 kg/(m^2) as calculated from the following:      Height as of this encounter: 5\' 4"  (1.626 m).      Weight as of this encounter: 110.678 kg (244 lb).  Patient also counseled that weight may inhibit the healing process  Patient counseled that losing weight will help with future health issues     Anastasio AuerbachMatthew S. Remedios Mckone   PAC  02/08/2013, 9:16 AM

## 2013-02-08 NOTE — Progress Notes (Signed)
Physical Therapy Treatment Patient Details Name: Traci Reed MRN: [id number] DOB: November 11, 1945 Today's Date: 02/08/2013 Time: 7829-56211055-1130 PT Time Calculation (min): 35 min  PT Assessment / Plan / Recommendation  History of Present Illness LTKA   PT Comments     Follow Up Recommendations  SNF     Does the patient have the potential to tolerate intense rehabilitation     Barriers to Discharge        Equipment Recommendations  Rolling walker with 5" wheels    Recommendations for Other Services OT consult  Frequency 7X/week   Progress towards PT Goals Progress towards PT goals: Progressing toward goals  Plan Current plan remains appropriate    Precautions / Restrictions Precautions Precautions: Fall Restrictions Weight Bearing Restrictions: No Other Position/Activity Restrictions: WBAT   Pertinent Vitals/Pain 4/10; premed,ice pack provided    Mobility  Bed Mobility Overal bed mobility: Needs Assistance Bed Mobility: Sit to Supine Sit to supine: Min assist;Mod assist General bed mobility comments: Cues for sequence and use of R LE to self assist Transfers Overall transfer level: Needs assistance Equipment used: Rolling walker (2 wheeled) Transfers: Sit to/from Stand Sit to Stand: Min assist General transfer comment: cues for LE management and use of UEs to self assist Ambulation/Gait Ambulation/Gait assistance: Min assist;Mod assist Ambulation Distance (Feet): 60 Feet (twice) Assistive device: Rolling walker (2 wheeled) Gait Pattern/deviations: Step-to pattern;Decreased step length - right;Decreased step length - left;Shuffle;Trunk flexed General Gait Details: Cues for sequence, posture and position from RW    Exercises Total Joint Exercises Ankle Circles/Pumps: AROM;15 reps;Both;Supine Quad Sets: AROM;Both;Supine;20 reps Heel Slides: AAROM;Left;Supine;20 reps Straight Leg Raises: AAROM;AROM;Left;Supine;20 reps   PT Diagnosis:    PT Problem List:   PT  Treatment Interventions:     PT Goals (current goals can now be found in the care plan section) Acute Rehab PT Goals Patient Stated Goal: i want to walk PT Goal Formulation: With patient/family Time For Goal Achievement: 02/13/13 Potential to Achieve Goals: Good  Visit Information  Last PT Received On: 02/08/13 Assistance Needed: +1 History of Present Illness: LTKA    Subjective Data  Patient Stated Goal: i want to walk   Cognition  Cognition Arousal/Alertness: Awake/alert Behavior During Therapy: WFL for tasks assessed/performed Overall Cognitive Status: Within Functional Limits for tasks assessed    Balance     End of Session PT - End of Session Equipment Utilized During Treatment: Gait belt Activity Tolerance: Patient tolerated treatment well Patient left: in bed;with call bell/phone within reach;with nursing/sitter in room Nurse Communication: Mobility status   GP     Suzy Kugel 02/08/2013, 12:56 PM

## 2013-02-08 NOTE — Discharge Summary (Signed)
Physician Discharge Summary  Patient ID: Hadley PenJudith W Stecher MRN: [id number] DOB/AGE: [birth year] 68 y.o.  Admit date: 02/06/2013 Discharge date:  02/09/2013   Procedures:  Procedure(s) (LRB): LEFT TOTAL KNEE ARTHROPLASTY (Left)  Attending Physician:  Dr. Durene RomansMatthew Olin   Admission Diagnoses:   Left knee OA / pain  Discharge Diagnoses:  Principal Problem:   S/P left TKA Active Problems:   Expected blood loss anemia   Morbid obesity  Past Medical History  Diagnosis Date  . Hypertension   . PONV (postoperative nausea and vomiting)     NAUSEA WITH SOME OF THE EARLIEST SURGERIES - NO PROBLEMS WITH MORE RECENT   . Diabetes mellitus     TYPE II- ON ORAL MEDICATION -NO INSULIN  . Arthritis     PAIN LEFT KNEE AND PT STATES SOME PAIN IN HER HIP AND BACK BECAUSE OF THE WAY SHE IS WALKING  . Anemia     ONLY AFTER SURGERIES    HPI: Hadley PenJudith W Wender, 68 y.o. female, has a history of pain and functional disability in the left knee due to arthritis and has failed non-surgical conservative treatments for greater than 12 weeks to includeNSAID's and/or analgesics, corticosteriod injections and activity modification. Onset of symptoms was gradual, starting >10 years ago with gradually worsening course since that time. The patient noted no past surgery on the left knee(s). Patient currently rates pain in the left knee(s) at 10 out of 10 with activity. Patient has night pain, worsening of pain with activity and weight bearing, pain that interferes with activities of daily living, pain with passive range of motion, crepitus and joint swelling. Patient has evidence of periarticular osteophytes and joint space narrowing by imaging studies. There is no active infection. Risks, benefits and expectations were discussed with the patient. Risks including but not limited to the risk of anesthesia, blood clots, nerve damage, blood vessel damage, failure of the prosthesis, infection and up to and including death. Patient  understand the risks, benefits and expectations and wishes to proceed with surgery.   PCP: Carmin RichmondAVIS,JAMES W, MD   Discharged Condition: good  Hospital Course:  Patient underwent the above stated procedure on 02/06/2013. Patient tolerated the procedure well and brought to the recovery room in good condition and subsequently to the floor.  POD #1 BP: 120/76 ; Pulse: 76 ; Temp: 98.4 F (36.9 C) ; Resp: 16  Patient reports pain as mild, pain controlled. Hypotensive issues yesterday and some nausea this morning. Neurovascular intact, dorsiflexion/plantar flexion intact, incision: dressing C/D/I, no cellulitis present and compartment soft.   LABS  Basename    HGB  9.3  HCT  28.4   POD #2  BP: 108/71 ; Pulse: 80 ; Temp: 98.6 F (37 C) ; Resp: 17  Patient reports pain as mild, pain controlled. States that the pain is better after this TKA, then after the right knee. No events throughout the night. She states that the hypotension is gone and the nausea has resolved.  Neurovascular intact, dorsiflexion/plantar flexion intact, incision: dressing C/D/I, no cellulitis present and compartment soft.   LABS  Basename    HGB  9.1  HCT  27.9   POD #3  BP: 130/88 ; Pulse: 72 ; Temp: 98.6 F (37 C) ; Resp: 18  Patient reports pain as mild, pain controlled. No events throughout the night. Ready to be discharged to skilled nursing facility. Neurovascular intact, dorsiflexion/plantar flexion intact, incision: dressing C/D/I, no cellulitis present and compartment soft.   LABS  No new labs  Discharge Exam: General appearance: alert, cooperative and no distress Extremities: Homans sign is negative, no sign of DVT, no edema, redness or tenderness in the calves or thighs and no ulcers, gangrene or trophic changes  Disposition:       Skilled nursing facility with follow up in 2 weeks   Follow-up Information   Follow up with Shelda Pal, MD. Schedule an appointment as soon as possible for a visit in 2  weeks.   Specialty:  Orthopedic Surgery   Contact information:   [address] [place] [address] [phone]       Discharge Orders   Future Orders Complete By Expires   Call MD / Call 911  As directed    Comments:     If you experience chest pain or shortness of breath, CALL 911 and be transported to the hospital emergency room.  If you develope a fever above 101 F, pus (white drainage) or increased drainage or redness at the wound, or calf pain, call your surgeon's office.   Change dressing  As directed    Comments:     Maintain surgical dressing for 10-14 days, or until follow up in the clinic.   Constipation Prevention  As directed    Comments:     Drink plenty of fluids.  Prune juice may be helpful.  You may use a stool softener, such as Colace (over the counter) 100 mg twice a day.  Use MiraLax (over the counter) for constipation as needed.   Diet - low sodium heart healthy  As directed    Discharge instructions  As directed    Comments:     Maintain surgical dressing for 10-14 days, or until follow up in the clinic. Follow up in 2 weeks at [facility]. Call with any questions or concerns.   Increase activity slowly as tolerated  As directed    TED hose  As directed    Comments:     Use stockings (TED hose) for 2 weeks on both leg(s).  You may remove them at night for sleeping.   Weight bearing as tolerated  As directed    Questions:     Laterality:     Extremity:          Medication List    STOP taking these medications       meloxicam 7.5 MG tablet  Commonly known as:  MOBIC      TAKE these medications       aspirin 325 MG EC tablet  Take 1 tablet (325 mg total) by mouth 2 (two) times daily.     Calcium Carbonate-Vit D-Min 1200-1000 MG-UNIT Chew  Chew 1 tablet by mouth 2 (two) times daily.     CINNAMON PO  Take 1 tablet by mouth 2 (two) times daily.     DSS 100 MG Caps  Take 100 mg by mouth 2 (two) times daily.      enalapril 2.5 MG tablet  Commonly known as:  VASOTEC  Take 2.5 mg by mouth every morning.     ferrous sulfate 325 (65 FE) MG tablet  Take 1 tablet (325 mg total) by mouth 3 (three) times daily after meals.     fluticasone 50 MCG/ACT nasal spray  Commonly known as:  FLONASE  Place 1 spray into the nose daily as needed for allergies or rhinitis.     GLUCOSAMINE-CHONDROITIN PO  Take 1 tablet by mouth 2 (two) times daily.  HYDROcodone-acetaminophen 7.5-325 MG per tablet  Commonly known as:  NORCO  Take 1-2 tablets by mouth every 4 (four) hours as needed for moderate pain.     metFORMIN 1000 MG tablet  Commonly known as:  GLUCOPHAGE  Take 1,000 mg by mouth 2 (two) times daily with a meal.     multivitamin with minerals Tabs tablet  Take 1 tablet by mouth daily before breakfast.     polyethylene glycol packet  Commonly known as:  MIRALAX / GLYCOLAX  Take 17 g by mouth 2 (two) times daily.     sennosides-docusate sodium 8.6-50 MG tablet  Commonly known as:  SENOKOT-S  Take 1 tablet by mouth daily as needed for constipation.     simvastatin 40 MG tablet  Commonly known as:  ZOCOR  Take 40 mg by mouth every evening.     tiZANidine 4 MG capsule  Commonly known as:  ZANAFLEX  Take 1 capsule (4 mg total) by mouth 3 (three) times daily as needed for muscle spasms.         Signed: Anastasio Auerbach. Teven Mittman   PAC  02/08/2013, 9:23 AM

## 2013-02-08 NOTE — Progress Notes (Signed)
Physical Therapy Treatment Patient Details Name: Traci Reed MRN: [id number] DOB: May 31, 1945 Today's Date: 02/08/2013 Time: [id number] PT Time Calculation (min): 28 min  PT Assessment / Plan / Recommendation  History of Present Illness LTKA   PT Comments     Follow Up Recommendations  SNF     Does the patient have the potential to tolerate intense rehabilitation     Barriers to Discharge        Equipment Recommendations  Rolling walker with 5" wheels    Recommendations for Other Services OT consult  Frequency 7X/week   Progress towards PT Goals Progress towards PT goals: Progressing toward goals  Plan Current plan remains appropriate    Precautions / Restrictions Precautions Precautions: Fall Restrictions Weight Bearing Restrictions: No Other Position/Activity Restrictions: WBAT   Pertinent Vitals/Pain 3/10; premed, ice packs provided    Mobility  Bed Mobility Overal bed mobility: Needs Assistance Bed Mobility: Sit to Supine;Supine to Sit Supine to sit: Min assist Sit to supine: Min assist General bed mobility comments: Cues for sequence and use of R LE to self assist Transfers Overall transfer level: Needs assistance Equipment used: Rolling walker (2 wheeled) Transfers: Sit to/from Stand Sit to Stand: Min assist General transfer comment: cues for LE management and use of UEs to self assist Ambulation/Gait Ambulation/Gait assistance: Min assist Ambulation Distance (Feet): 98 Feet (twice and 25') Assistive device: Rolling walker (2 wheeled) Gait Pattern/deviations: Step-to pattern;Shuffle;Trunk flexed;Decreased step length - right;Decreased step length - left General Gait Details: Cues for sequence, posture and position from RW    Exercises     PT Diagnosis:    PT Problem List:   PT Treatment Interventions:     PT Goals (current goals can now be found in the care plan section) Acute Rehab PT Goals Patient Stated Goal: i want to walk PT Goal  Formulation: With patient/family Time For Goal Achievement: 02/13/13 Potential to Achieve Goals: Good  Visit Information  Last PT Received On: 02/08/13 Assistance Needed: +1 History of Present Illness: LTKA    Subjective Data  Subjective: I need to use the bathroom Patient Stated Goal: i want to walk   Cognition  Cognition Arousal/Alertness: Awake/alert Behavior During Therapy: WFL for tasks assessed/performed Overall Cognitive Status: Within Functional Limits for tasks assessed    Balance     End of Session PT - End of Session Equipment Utilized During Treatment: Gait belt Activity Tolerance: Patient tolerated treatment well Patient left: in bed;with call bell/phone within reach;with nursing/sitter in room Nurse Communication: Mobility status   GP     Traci Reed 02/08/2013, 4:38 PM

## 2013-02-09 LAB — GLUCOSE, CAPILLARY
Glucose-Capillary: 149 mg/dL — ABNORMAL HIGH (ref 70–99)
Glucose-Capillary: 125 mg/dL — ABNORMAL HIGH (ref 70–99)

## 2013-02-09 NOTE — Progress Notes (Signed)
Physical Therapy Treatment Patient Details Name: Traci Reed MRN: [id number] DOB: October 12, 1945 Today's Date: 02/09/2013 Time: 1011-1032 PT Time Calculation (min): 21 min  PT Assessment / Plan / Recommendation  History of Present Illness LTKA   PT Comments     Follow Up Recommendations  SNF     Does the patient have the potential to tolerate intense rehabilitation     Barriers to Discharge        Equipment Recommendations  Rolling walker with 5" wheels    Recommendations for Other Services OT consult  Frequency 7X/week   Progress towards PT Goals Progress towards PT goals: Progressing toward goals  Plan Current plan remains appropriate    Precautions / Restrictions Precautions Precautions: Fall Restrictions Weight Bearing Restrictions: No Other Position/Activity Restrictions: WBAT   Pertinent Vitals/Pain 5/10; premed, ice packs provided    Mobility       Exercises Total Joint Exercises Ankle Circles/Pumps: AROM;Both;Supine;20 reps Quad Sets: AROM;Both;Supine;20 reps Heel Slides: AAROM;Left;Supine;20 reps Straight Leg Raises: AAROM;AROM;Left;Supine;20 reps   PT Diagnosis:    PT Problem List:   PT Treatment Interventions:     PT Goals (current goals can now be found in the care plan section) Acute Rehab PT Goals Patient Stated Goal: i want to walk PT Goal Formulation: With patient/family Time For Goal Achievement: 02/13/13 Potential to Achieve Goals: Good  Visit Information  Last PT Received On: 02/09/13 Assistance Needed: +1 History of Present Illness: LTKA    Subjective Data  Subjective: Fatigued after showering and getting dressed Patient Stated Goal: i want to walk   Cognition  Cognition Arousal/Alertness: Awake/alert Behavior During Therapy: WFL for tasks assessed/performed Overall Cognitive Status: Within Functional Limits for tasks assessed    Balance     End of Session PT - End of Session Activity Tolerance: Patient tolerated treatment  well Patient left: in chair;with call bell/phone within reach   GP     [name],Traci Beadles 02/09/2013, 12:37 PM

## 2013-02-09 NOTE — Progress Notes (Signed)
   Subjective: 3 Days Post-Op Procedure(s) (LRB): LEFT TOTAL KNEE ARTHROPLASTY (Left)   Patient reports pain as mild, pain controlled. No events throughout the night. Ready to be discharged to skilled nursing facility.  Objective:   VITALS:   Filed Vitals:   02/09/13 0626  BP: 130/88  Pulse: 72  Temp: 98.6 F (37 C)  Resp: 18    Neurovascular intact Dorsiflexion/Plantar flexion intact Incision: dressing C/D/I No cellulitis present Compartment soft  LABS  Recent Labs  02/07/13 0435 02/08/13 0445  HGB 9.3* 9.1*  HCT 28.4* 27.9*  WBC 6.0 5.9  PLT 141* 130*     Recent Labs  02/07/13 0435 02/08/13 0445  NA 136* 137  K 4.9 4.4  BUN 11 8  CREATININE 0.65 0.62  GLUCOSE 142* 158*     Assessment/Plan: 3 Days Post-Op Procedure(s) (LRB): LEFT TOTAL KNEE ARTHROPLASTY (Left) Up with therapy Discharge to SNF [facility]. Follow up with OLIN,Dvora Buitron D in 2 weeks.  Contact information:  [facility] [address] [place] [id number] [phone]        Anastasio AuerbachMatthew S. Bevely Hackbart   PAC  02/09/2013, 7:58 AM

## 2013-02-09 NOTE — Progress Notes (Signed)
[facility] is providing the following services: Patient declined rw and commode.  Has both at home.    If patient discharges after hours, please call [phone].   Traci HamperLecretia Williamson 02/09/2013, 10:35 AM

## 2013-02-09 NOTE — Progress Notes (Signed)
Patient rash has improved since benadryl given. Rash on small part of lower back and buttox. Patient does not complain of itching.

## 2013-02-09 NOTE — Progress Notes (Signed)
Clinical Social Work Department CLINICAL SOCIAL WORK PLACEMENT NOTE 02/09/2013  Patient:  Traci Reed,Traci Reed  Account Number:  [id number] Admit date:  02/06/2013  Clinical Social Worker:  Cori RazorJAMIE Ronny Ruddell, LCSW  Date/time:  02/06/2013 05:04 PM  Clinical Social Work is seeking post-discharge placement for this patient at the following level of care:   SKILLED NURSING   (*CSW will update this form in Epic as items are completed)     Patient/family provided with [facility] Department of Clinical Social Work's list of facilities offering this level of care within the geographic area requested by the patient (or if unable, by the patient's family).  02/06/2013  Patient/family informed of their freedom to choose among providers that offer the needed level of care, that participate in Medicare, Medicaid or managed care program needed by the patient, have an available bed and are willing to accept the patient.    Patient/family informed of [facility]' ownership interest in [facility], as well as of the fact that they are under no obligation to receive care at this facility.  PASARR submitted to EDS on 02/06/2013 PASARR number received from EDS on 02/06/2013  FL2 transmitted to all facilities in geographic area requested by pt/family on  02/06/2013 FL2 transmitted to all facilities within larger geographic area on   Patient informed that his/her managed care company has contracts with or will negotiate with  certain facilities, including the following:     Patient/family informed of bed offers received:  02/06/2013 [facility] Physician recommends and patient chooses bed at    [facility]  02/09/2013 Patient to be transferred to facility by [facility]  The following physician request were entered in Epic:   Additional Comments:  Cori RazorJamie Latriece Anstine LCSW [phone]

## 2013-02-09 NOTE — Progress Notes (Signed)
Red rash noted on patient lower back and buttox. Patient complains of itching. Lowe's CompaniesMatt Babish PA notified. Ordered to give benadryl.

## 2013-02-09 NOTE — Care Management Note (Signed)
    Page 1 of 1   02/09/2013     9:09:38 AM   CARE MANAGEMENT NOTE 02/09/2013  Patient:  Traci Reed,Traci Reed   Account Number:  [id number]  Date Initiated:  02/09/2013  Documentation initiated by:  Colleen CanMANNING,Larry Knipp  Subjective/Objective Assessment:   dx left knee osteoarthritis: Total knee replacemnt     Action/Plan:   Plans are for SNF rehab.   Anticipated DC Date:  02/09/2013   Anticipated DC Plan:  SKILLED NURSING FACILITY  In-house referral  Clinical Social Worker      DC Planning Services  CM consult      Choice offered to / List presented to:             Status of service:  Completed, signed off Medicare Important Message given?  NA - LOS <3 / Initial given by admissions (If response is "NO", the following Medicare IM given date fields will be blank) Date Medicare IM given:   Date Additional Medicare IM given:    Discharge Disposition:    Per UR Regulation:    If discussed at Long Length of Stay Meetings, dates discussed:    Comments:

## 2013-02-13 ENCOUNTER — Non-Acute Institutional Stay (SKILLED_NURSING_FACILITY): Payer: Medicare Other | Admitting: Adult Health

## 2013-02-13 DIAGNOSIS — M1712 Unilateral primary osteoarthritis, left knee: Secondary | ICD-10-CM | POA: Insufficient documentation

## 2013-02-13 DIAGNOSIS — D62 Acute posthemorrhagic anemia: Secondary | ICD-10-CM

## 2013-02-13 DIAGNOSIS — M1710 Unilateral primary osteoarthritis, unspecified knee: Secondary | ICD-10-CM

## 2013-02-13 DIAGNOSIS — E119 Type 2 diabetes mellitus without complications: Secondary | ICD-10-CM | POA: Insufficient documentation

## 2013-02-13 DIAGNOSIS — I10 Essential (primary) hypertension: Secondary | ICD-10-CM | POA: Insufficient documentation

## 2013-02-13 DIAGNOSIS — R21 Rash and other nonspecific skin eruption: Secondary | ICD-10-CM | POA: Insufficient documentation

## 2013-02-13 DIAGNOSIS — E785 Hyperlipidemia, unspecified: Secondary | ICD-10-CM

## 2013-02-13 DIAGNOSIS — IMO0002 Reserved for concepts with insufficient information to code with codable children: Secondary | ICD-10-CM

## 2013-02-13 NOTE — Progress Notes (Signed)
Patient ID: Traci Reed, female   DOB: [birth year], 68 y.o.   MRN: [id number]               PROGRESS NOTE  DATE: 02/13/2013  FACILITY: Nursing Home [facility]  LEVEL OF CARE: SNF (31)  Acute Visit  CHIEF COMPLAINT:  Follow-up Hospitalization  HISTORY OF PRESENT ILLNESS: This is a 68 year old female who [facility] with Osteoarthritis S/P left total knee arthroplasty. She has been admitted for a short-term rehabilitation.   REASSESSMENT OF ONGOING PROBLEM(S):  HTN: Pt 's HTN remains stable.  Denies CP, sob, DOE, pedal edema, headaches, dizziness or visual disturbances.  No complications from the medications currently being used.  Last BP : 111/66  DM:pt's DM remains stable.  Pt denies polyuria, polydipsia, polyphagia, changes in vision or hypoglycemic episodes.  No complications noted from the medication presently being used.       GLUCAP 138* 149* 125*   ANEMIA: The anemia has been stable. The patient denies fatigue, melena or hematochezia. No complications from the medications currently being used. 2/15 hgb 9.1   PAST MEDICAL HISTORY : Reviewed.  No changes.  CURRENT MEDICATIONS: [facility]  REVIEW OF SYSTEMS:  GENERAL: no change in appetite, no fatigue, no weight changes, no fever, chills or weakness RESPIRATORY: no cough, SOB, DOE, wheezing, hemoptysis CARDIAC: no chest pain, edema or palpitations GI: no abdominal pain, diarrhea, constipation, heart burn, nausea or vomiting  PHYSICAL EXAMINATION  VS:  T98.7       P90      RR18      BP111/66          WT252.4 (Lb)  GENERAL: no acute distress, obese EYES: conjunctivae normal, sclerae normal, normal eye lids NECK: supple, trachea midline, no neck masses, no thyroid tenderness, no thyromegaly LYMPHATICS: no LAN in the neck, no supraclavicular LAN RESPIRATORY: breathing is even & unlabored, BS CTAB CARDIAC: RRR, no murmur,no  extra heart sounds, no edema GI: abdomen soft, normal BS, no masses, no tenderness, no hepatomegaly, no splenomegaly PSYCHIATRIC: the patient is alert & oriented to person, affect & behavior appropriate  LABS/RADIOLOGY: Labs reviewed: Basic Metabolic Panel:  Recent Labs  40/98/1101/30/15 1345 02/07/13 0435 02/08/13 0445  NA 141 136* 137  K 4.3 4.9 4.4  CL 103 102 102  CO2 26 23 27   GLUCOSE 206* 142* 158*  BUN 14 11 8   CREATININE 0.69 0.65 0.62  CALCIUM 9.9 8.4 8.7   CBC:  Recent Labs  02/02/13 1345 02/07/13 0435 02/08/13 0445  WBC 5.3 6.0 5.9  HGB 12.1 9.3* 9.1*  HCT 36.6 28.4* 27.9*  MCV 83.4 84.0 84.8  PLT 203 141* 130*   CBG:  Recent Labs  02/08/13 2148 02/09/13 0721 02/09/13 1148  GLUCAP 138* 149* 125*     ASSESSMENT/PLAN:  Osteoarthritis status post left total knee arthroplasty - for rehabilitation  Hypertension - well controlled  Anemia - stable; check CBC  Diabetes mellitus, type II - well controlled  Rashes - apply hydrocortisone cream 0.5% to rashes on back BID x 14 days  Pruritus - Benadryl 25 mg 1 PO Q 8 hours PRN    CPT CODE: [id number]

## 2013-02-19 ENCOUNTER — Non-Acute Institutional Stay (SKILLED_NURSING_FACILITY): Payer: Medicare Other | Admitting: Adult Health

## 2013-02-19 DIAGNOSIS — M1710 Unilateral primary osteoarthritis, unspecified knee: Secondary | ICD-10-CM

## 2013-02-19 DIAGNOSIS — E119 Type 2 diabetes mellitus without complications: Secondary | ICD-10-CM

## 2013-02-19 DIAGNOSIS — M1712 Unilateral primary osteoarthritis, left knee: Secondary | ICD-10-CM

## 2013-02-19 DIAGNOSIS — IMO0002 Reserved for concepts with insufficient information to code with codable children: Secondary | ICD-10-CM

## 2013-02-19 DIAGNOSIS — D62 Acute posthemorrhagic anemia: Secondary | ICD-10-CM

## 2013-02-19 DIAGNOSIS — I10 Essential (primary) hypertension: Secondary | ICD-10-CM

## 2013-02-19 DIAGNOSIS — E785 Hyperlipidemia, unspecified: Secondary | ICD-10-CM

## 2013-02-22 ENCOUNTER — Encounter: Payer: Self-pay | Admitting: Adult Health

## 2013-02-22 NOTE — Progress Notes (Signed)
Patient ID: Traci Reed, female   DOB: 01-Sep-1945, 68 y.o.   MRN: [id number]         PROGRESS NOTE  DATE:  02/19/13  FACILITY: Nursing Home [facility]: [facility]  LEVEL OF CARE: SNF (31)  Acute Visit  CHIEF COMPLAINT:  Discharge Notes  HISTORY OF PRESENT ILLNESS: This is a 68 year old female who is for discharge home with Home health PT, OT and Nursing.. [facility] with Osteoarthritis S/P left total knee arthroplasty.Patient was admitted to this facility for short-term rehabilitation after the patient's recent hospitalization.  Patient has completed SNF rehabilitation and therapy has cleared the patient for discharge.   REASSESSMENT OF ONGOING PROBLEM(S):  HYPERLIPIDEMIA: No complications from the medications presently being used.   HTN: Pt 's HTN remains stable.  Denies CP, sob, DOE, pedal edema, headaches, dizziness or visual disturbances.  No complications from the medications currently being used.  Last BP : 111/56  ANEMIA: The anemia has been stable. The patient denies fatigue, melena or hematochezia. No complications from the medications currently being used. 2/15 hgb 9.0   PAST MEDICAL HISTORY : Reviewed.  No changes.  [facility] MEDICATIONS: [facility]  REVIEW OF SYSTEMS:  GENERAL: no change in appetite, no fatigue, no weight changes, no fever, chills or weakness RESPIRATORY: no cough, SOB, DOE, wheezing, hemoptysis CARDIAC: no chest pain, edema or palpitations GI: no abdominal pain, diarrhea, constipation, heart burn, nausea or vomiting  PHYSICAL EXAMINATION  GENERAL: no acute distress, obese NECK: supple, trachea midline, no neck masses, no thyroid tenderness, no thyromegaly LYMPHATICS: no LAN in the neck, no supraclavicular LAN RESPIRATORY: breathing is even & unlabored, BS CTAB CARDIAC: RRR, no murmur,no extra heart sounds, no edema GI: abdomen soft, normal BS, no masses, no  tenderness, no hepatomegaly, no splenomegaly PSYCHIATRIC: the patient is alert & oriented to person, affect & behavior appropriate  LABS/RADIOLOGY: Labs reviewed: 02/13/13  Wbc 5.0  hgb 9.0  hct 30.0 NA 138  K 3.6  Glucose 238  BUN 13  Creatinine 0.6  CA 9.0 Basic Metabolic Panel:  Recent Labs  40/98/1101/30/15 1345 02/07/13 0435 02/08/13 0445  NA 141 136* 137  K 4.3 4.9 4.4  CL 103 102 102  CO2 26 23 27   GLUCOSE 206* 142* 158*  BUN 14 11 8   CREATININE 0.69 0.65 0.62  CALCIUM 9.9 8.4 8.7   CBC:  Recent Labs  02/02/13 1345 02/07/13 0435 02/08/13 0445  WBC 5.3 6.0 5.9  HGB 12.1 9.3* 9.1*  HCT 36.6 28.4* 27.9*  MCV 83.4 84.0 84.8  PLT 203 141* 130*   CBG:  Recent Labs  02/08/13 2148 02/09/13 0721 02/09/13 1148  GLUCAP 138* 149* 125*     ASSESSMENT/PLAN:  Osteoarthritis status post left total knee arthroplasty - for Home health PT, OT and Nursing  Hypertension - well controlled  Anemia - stable  Diabetes mellitus, type II - well controlled   I have filled out patient's discharge paperwork and written prescriptions.  Patient will receive home health PT, OT and Nursing.   Total discharge time: Less than 30 minutes Discharge time involved coordination of the discharge process with Child psychotherapistsocial worker, nursing staff and therapy department. Medical justification for home health services verified.  CPT CODE: [id number]  Ella BodoMonina Vargas - NP [facility] [phone]

## 2014-10-21 IMAGING — CR DG CHEST 2V
2 series · 2 of 2 positions shown · non-contrast
Comparison: DG CHEST 2 VIEW dated 01/21/2011

CLINICAL DATA: Preoperative study for left knee replacement,
history of diabetes and remote history of smoking

EXAM:
CHEST  2 VIEW

[w chest pa]
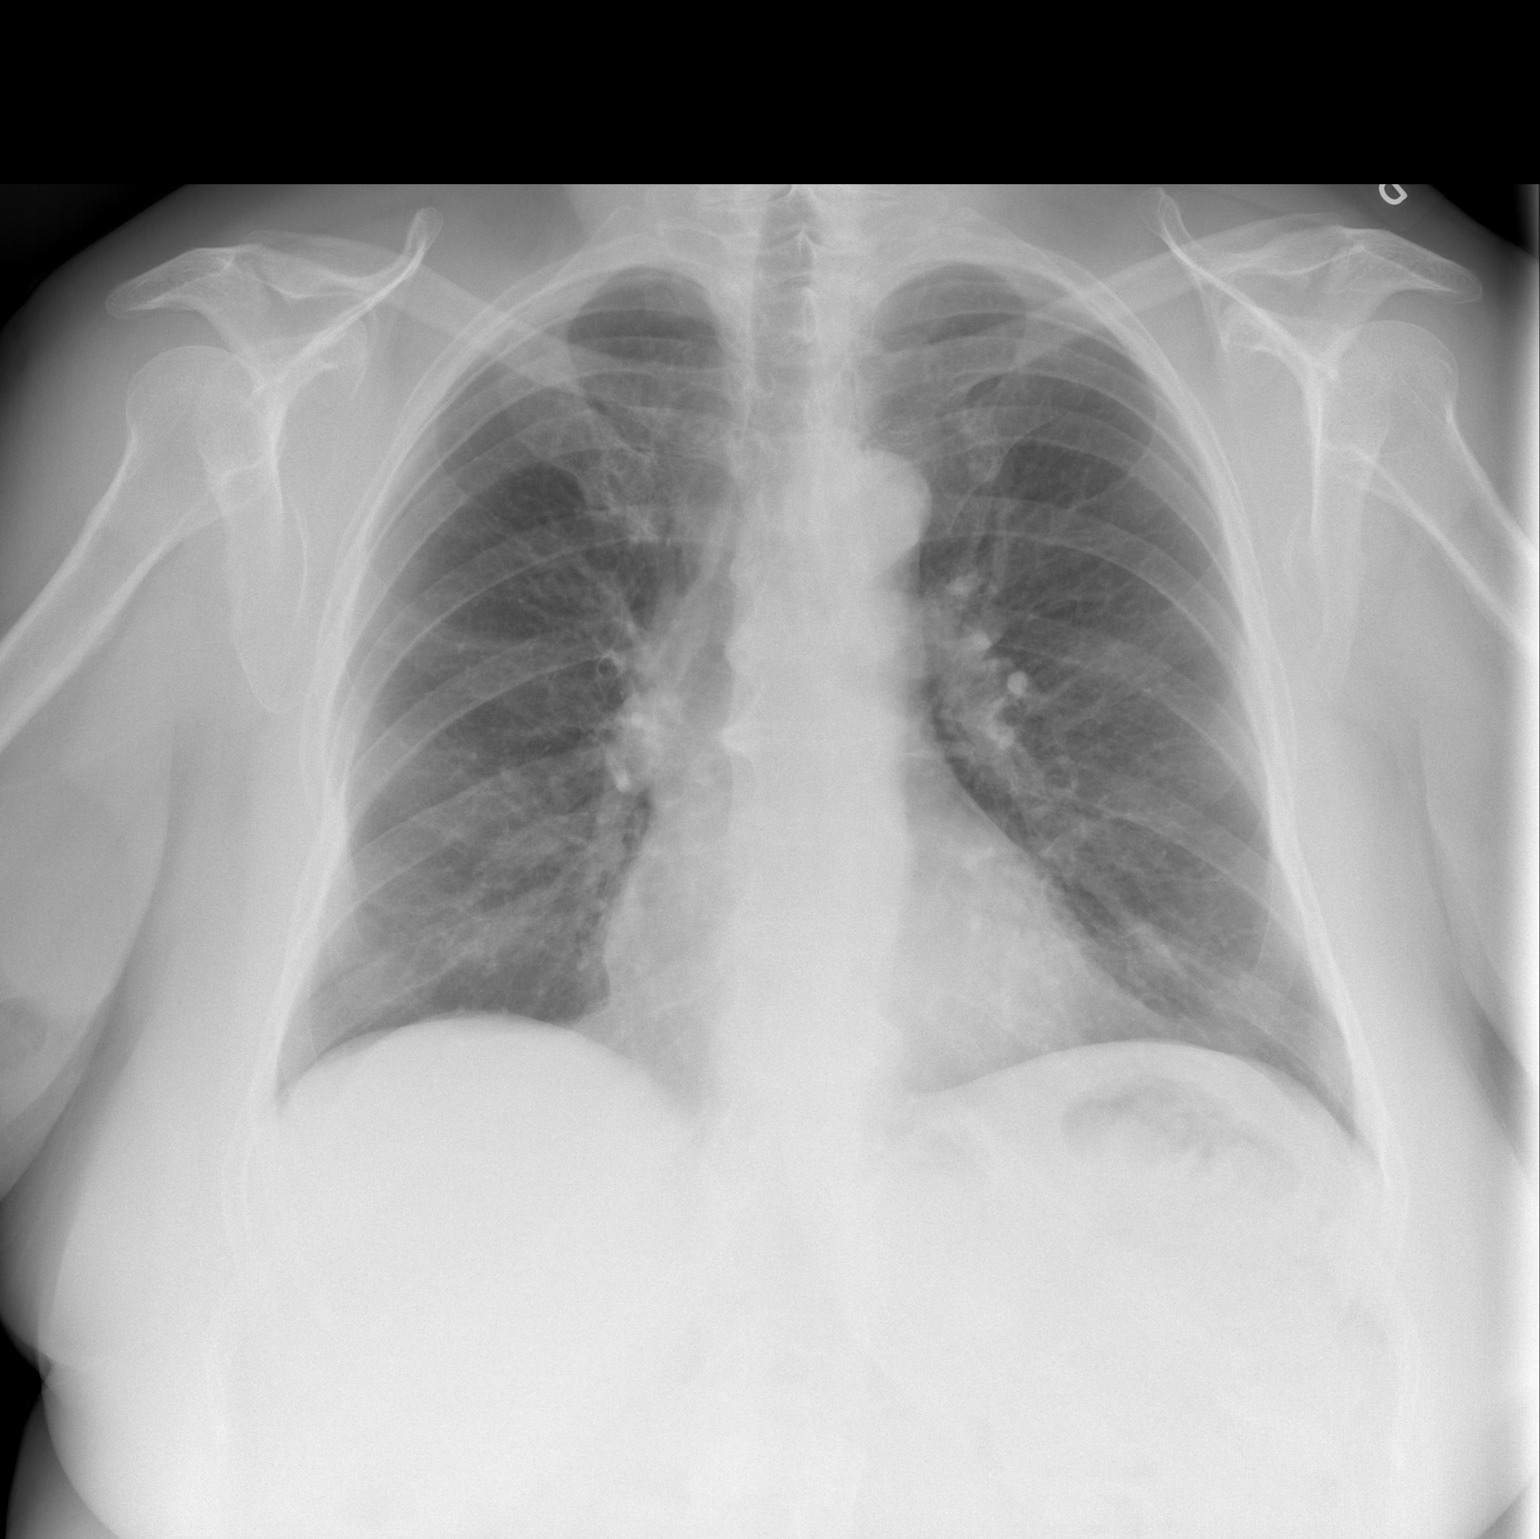

[w chest lat]
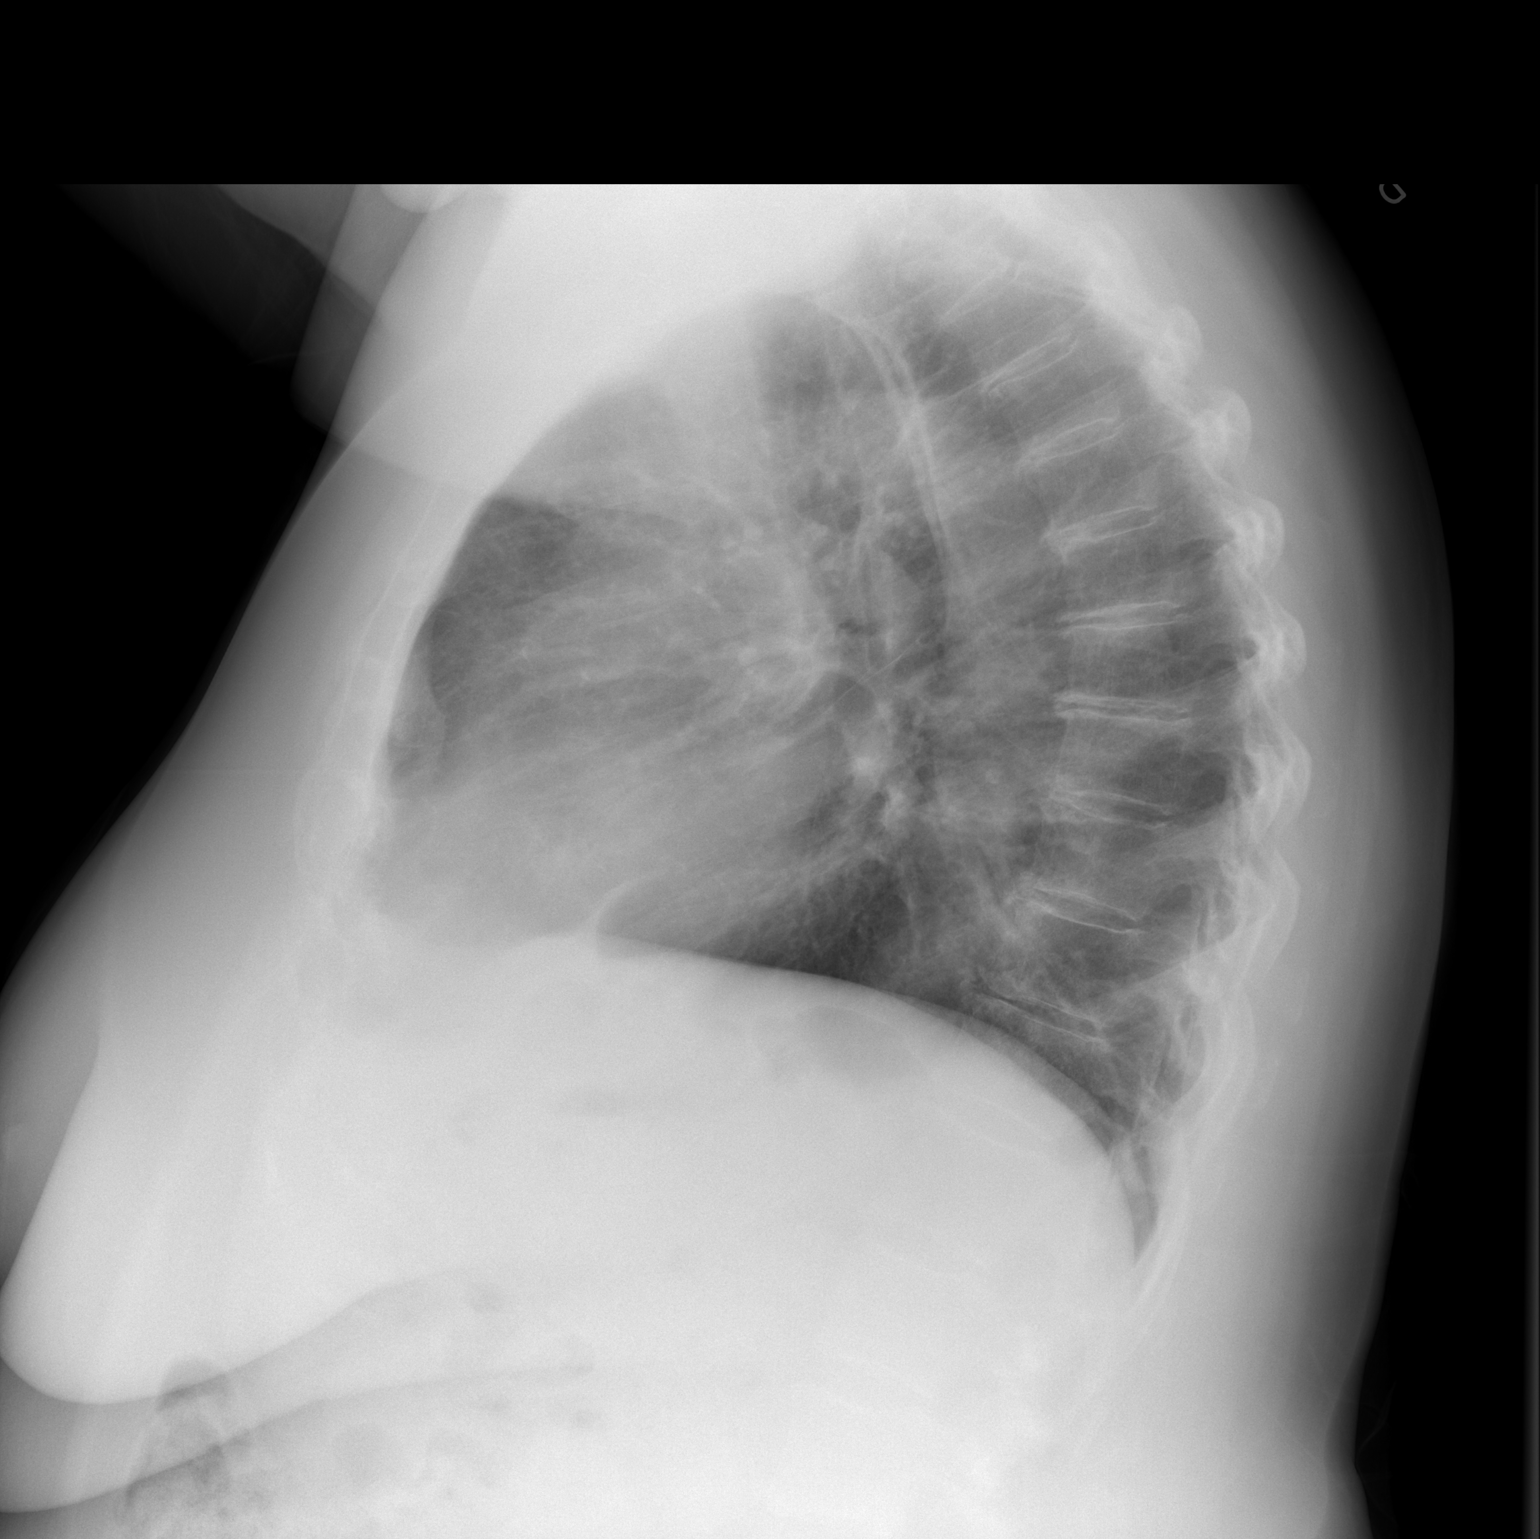

[2 of 2 positions shown; findings below may reference images not displayed]

FINDINGS: The lungs are adequately inflated. There is no focal infiltrate.
There is no pleural effusion or pneumothorax. The cardiac silhouette
is normal in size. The pulmonary vascularity is not engorged. The
mediastinum is normal in width. There is mild degenerative disc
change of the mid thoracic spine.
IMPRESSION: There is no evidence of active cardiopulmonary disease.

## 2023-11-05 DEATH — deceased
